# Patient Record
Sex: Female | Born: 1935 | Race: White | Hispanic: No | Smoking: Never smoker
Health system: Southern US, Community
[De-identification: ages and names within clinical notes are randomized; demographics above are authoritative.]

## PROBLEM LIST (undated history)

## (undated) DIAGNOSIS — H0230 Blepharochalasis unspecified eye, unspecified eyelid: Secondary | ICD-10-CM

## (undated) DIAGNOSIS — G629 Polyneuropathy, unspecified: Secondary | ICD-10-CM

## (undated) DIAGNOSIS — K589 Irritable bowel syndrome without diarrhea: Secondary | ICD-10-CM

## (undated) DIAGNOSIS — M810 Age-related osteoporosis without current pathological fracture: Secondary | ICD-10-CM

## (undated) HISTORY — DX: Blepharochalasis unspecified eye, unspecified eyelid: H02.30

## (undated) HISTORY — PX: CATARACT EXTRACTION: SUR2

## (undated) HISTORY — DX: Polyneuropathy, unspecified: G62.9

## (undated) HISTORY — DX: Irritable bowel syndrome, unspecified: K58.9

## (undated) HISTORY — DX: Age-related osteoporosis without current pathological fracture: M81.0

---

## 1968-01-14 HISTORY — PX: ABDOMINAL HYSTERECTOMY: SHX81

## 1992-01-14 HISTORY — PX: KNEE SURGERY: SHX244

## 2006-01-13 HISTORY — PX: WRIST SURGERY: SHX841

## 2014-09-07 ENCOUNTER — Ambulatory Visit (INDEPENDENT_AMBULATORY_CARE_PROVIDER_SITE_OTHER): Payer: Medicare Other

## 2014-09-07 ENCOUNTER — Ambulatory Visit (INDEPENDENT_AMBULATORY_CARE_PROVIDER_SITE_OTHER): Payer: Medicare Other | Admitting: Podiatry

## 2014-09-07 ENCOUNTER — Encounter: Payer: Self-pay | Admitting: Podiatry

## 2014-09-07 VITALS — BP 152/76 | HR 66 | Resp 16

## 2014-09-07 DIAGNOSIS — M79672 Pain in left foot: Secondary | ICD-10-CM | POA: Diagnosis not present

## 2014-09-07 DIAGNOSIS — M722 Plantar fascial fibromatosis: Secondary | ICD-10-CM | POA: Diagnosis not present

## 2014-09-07 NOTE — Patient Instructions (Signed)
Plantar Fasciitis (Heel Spur Syndrome) with Rehab The plantar fascia is a fibrous, ligament-like, soft-tissue structure that spans the bottom of the foot. Plantar fasciitis is a condition that causes pain in the foot due to inflammation of the tissue. SYMPTOMS   Pain and tenderness on the underneath side of the foot.  Pain that worsens with standing or walking. CAUSES  Plantar fasciitis is caused by irritation and injury to the plantar fascia on the underneath side of the foot. Common mechanisms of injury include:  Direct trauma to bottom of the foot.  Damage to a small nerve that runs under the foot where the main fascia attaches to the heel bone.  Stress placed on the plantar fascia due to bone spurs. RISK INCREASES WITH:   Activities that place stress on the plantar fascia (running, jumping, pivoting, or cutting).  Poor strength and flexibility.  Improperly fitted shoes.  Tight calf muscles.  Flat feet.  Failure to warm-up properly before activity.  Obesity. PREVENTION  Warm up and stretch properly before activity.  Allow for adequate recovery between workouts.  Maintain physical fitness:  Strength, flexibility, and endurance.  Cardiovascular fitness.  Maintain a health body weight.  Avoid stress on the plantar fascia.  Wear properly fitted shoes, including arch supports for individuals who have flat feet. PROGNOSIS  If treated properly, then the symptoms of plantar fasciitis usually resolve without surgery. However, occasionally surgery is necessary. RELATED COMPLICATIONS   Recurrent symptoms that may result in a chronic condition.  Problems of the lower back that are caused by compensating for the injury, such as limping.  Pain or weakness of the foot during push-off following surgery.  Chronic inflammation, scarring, and partial or complete fascia tear, occurring more often from repeated injections. TREATMENT  Treatment initially involves the use of  ice and medication to help reduce pain and inflammation. The use of strengthening and stretching exercises may help reduce pain with activity, especially stretches of the Achilles tendon. These exercises may be performed at home or with a therapist. Your caregiver may recommend that you use heel cups of arch supports to help reduce stress on the plantar fascia. Occasionally, corticosteroid injections are given to reduce inflammation. If symptoms persist for greater than 6 months despite non-surgical (conservative), then surgery may be recommended.  MEDICATION   If pain medication is necessary, then nonsteroidal anti-inflammatory medications, such as aspirin and ibuprofen, or other minor pain relievers, such as acetaminophen, are often recommended.  Do not take pain medication within 7 days before surgery.  Prescription pain relievers may be given if deemed necessary by your caregiver. Use only as directed and only as much as you need.  Corticosteroid injections may be given by your caregiver. These injections should be reserved for the most serious cases, because they may only be given a certain number of times. HEAT AND COLD  Cold treatment (icing) relieves pain and reduces inflammation. Cold treatment should be applied for 10 to 15 minutes every 2 to 3 hours for inflammation and pain and immediately after any activity that aggravates your symptoms. Use ice packs or massage the area with a piece of ice (ice massage).  Heat treatment may be used prior to performing the stretching and strengthening activities prescribed by your caregiver, physical therapist, or athletic trainer. Use a heat pack or soak the injury in warm water. SEEK IMMEDIATE MEDICAL CARE IF:  Treatment seems to offer no benefit, or the condition worsens.  Any medications produce adverse side effects. EXERCISES RANGE   OF MOTION (ROM) AND STRETCHING EXERCISES - Plantar Fasciitis (Heel Spur Syndrome) These exercises may help you  when beginning to rehabilitate your injury. Your symptoms may resolve with or without further involvement from your physician, physical therapist or athletic trainer. While completing these exercises, remember:   Restoring tissue flexibility helps normal motion to return to the joints. This allows healthier, less painful movement and activity.  An effective stretch should be held for at least 30 seconds.  A stretch should never be painful. You should only feel a gentle lengthening or release in the stretched tissue. RANGE OF MOTION - Toe Extension, Flexion  Sit with your right / left leg crossed over your opposite knee.  Grasp your toes and gently pull them back toward the top of your foot. You should feel a stretch on the bottom of your toes and/or foot.  Hold this stretch for __________ seconds.  Now, gently pull your toes toward the bottom of your foot. You should feel a stretch on the top of your toes and or foot.  Hold this stretch for __________ seconds. Repeat __________ times. Complete this stretch __________ times per day.  RANGE OF MOTION - Ankle Dorsiflexion, Active Assisted  Remove shoes and sit on a chair that is preferably not on a carpeted surface.  Place right / left foot under knee. Extend your opposite leg for support.  Keeping your heel down, slide your right / left foot back toward the chair until you feel a stretch at your ankle or calf. If you do not feel a stretch, slide your bottom forward to the edge of the chair, while still keeping your heel down.  Hold this stretch for __________ seconds. Repeat __________ times. Complete this stretch __________ times per day.  STRETCH - Gastroc, Standing  Place hands on wall.  Extend right / left leg, keeping the front knee somewhat bent.  Slightly point your toes inward on your back foot.  Keeping your right / left heel on the floor and your knee straight, shift your weight toward the wall, not allowing your back to  arch.  You should feel a gentle stretch in the right / left calf. Hold this position for __________ seconds. Repeat __________ times. Complete this stretch __________ times per day. STRETCH - Soleus, Standing  Place hands on wall.  Extend right / left leg, keeping the other knee somewhat bent.  Slightly point your toes inward on your back foot.  Keep your right / left heel on the floor, bend your back knee, and slightly shift your weight over the back leg so that you feel a gentle stretch deep in your back calf.  Hold this position for __________ seconds. Repeat __________ times. Complete this stretch __________ times per day. STRETCH - Gastrocsoleus, Standing  Note: This exercise can place a lot of stress on your foot and ankle. Please complete this exercise only if specifically instructed by your caregiver.   Place the ball of your right / left foot on a step, keeping your other foot firmly on the same step.  Hold on to the wall or a rail for balance.  Slowly lift your other foot, allowing your body weight to press your heel down over the edge of the step.  You should feel a stretch in your right / left calf.  Hold this position for __________ seconds.  Repeat this exercise with a slight bend in your right / left knee. Repeat __________ times. Complete this stretch __________ times per day.    STRENGTHENING EXERCISES - Plantar Fasciitis (Heel Spur Syndrome)  These exercises may help you when beginning to rehabilitate your injury. They may resolve your symptoms with or without further involvement from your physician, physical therapist or athletic trainer. While completing these exercises, remember:   Muscles can gain both the endurance and the strength needed for everyday activities through controlled exercises.  Complete these exercises as instructed by your physician, physical therapist or athletic trainer. Progress the resistance and repetitions only as guided. STRENGTH -  Towel Curls  Sit in a chair positioned on a non-carpeted surface.  Place your foot on a towel, keeping your heel on the floor.  Pull the towel toward your heel by only curling your toes. Keep your heel on the floor.  If instructed by your physician, physical therapist or athletic trainer, add ____________________ at the end of the towel. Repeat __________ times. Complete this exercise __________ times per day. STRENGTH - Ankle Inversion  Secure one end of a rubber exercise band/tubing to a fixed object (table, pole). Loop the other end around your foot just before your toes.  Place your fists between your knees. This will focus your strengthening at your ankle.  Slowly, pull your big toe up and in, making sure the band/tubing is positioned to resist the entire motion.  Hold this position for __________ seconds.  Have your muscles resist the band/tubing as it slowly pulls your foot back to the starting position. Repeat __________ times. Complete this exercises __________ times per day.  Document Released: 12/30/2004 Document Revised: 03/24/2011 Document Reviewed: 04/13/2008 Exit[employer] Patient Information 2015 [employer]. This information is not intended to replace advice given to you by your health care provider. Make sure you discuss any questions you have with your health care provider.  

## 2014-09-07 NOTE — Progress Notes (Signed)
Subjective:     Patient ID: Angie Perez, female   DOB: 1927/03/12, 81 y.o.   MRN: [id number]  HPI  81 year old female presents the office today with concerns of left heel pain which started in June after she was moving going up and down steps.  She states the pain is the bottom of her heel on the throbbing sensation has been ongoing.She states that she has pain after being on her feet or prolonged periods of walking. She denies any swelling or redness. No tingling or numbness. The pain does not wake her up at night. She said no prior treatment. Denies any specific injury or trauma. No other complaints at this time.  Review of Systems  All other systems reviewed and are negative.      Objective:   Physical Exam AAO x3, NAD DP/PT pulses palpable bilaterally, CRT less than 3 seconds Protective sensation intact with Simms Weinstein monofilament, vibratory sensation intact, Achilles tendon reflex intact; negative tinel sign  Tenderness to palpation overlying the plantar medial tubercle of the calcaneus to the left heel at the insertion of the plantar fascia. There is no pain along the course of plantar fascia within the arch of the foot. There is no pain with lateral compression of the calcaneus or pain the vibratory sensation. No pain on the posterior aspect of the calcaneus or along the course/insertion of the Achilles tendon. There is no overlying edema, erythema, increase in warmth. No other areas of tenderness palpation or pain with vibratory sensation to the foot/ankle. MMT 5/5, ROM WNL No open lesions or pre-ulcerative lesions are identified. No pain with calf compression, swelling, warmth, erythema.     Assessment:      81 year old female with left heel pain, likely plantar fasciitis.     Plan:     -X-rays were obtained and reviewed with the patient.  -Treatment options discussed including all alternatives, risks, and complications - I discussed steroid injection however she wishes to  hold off on that at this time. She also states that she is unable to take a lot of anti-inflammatories due to stomach problems. -Ice to the area daily. -Stretching exercises daily. -Discussed supportive shoe gear. -Dispensed plantar fascial brace. -Discuss orthotics. -Follow-up 4 weeks or sooner if any problems arise. In the meantime, encouraged to call the office with any questions, concerns, change in symptoms.   Celesta Gentile, DPM

## 2014-10-05 ENCOUNTER — Ambulatory Visit (INDEPENDENT_AMBULATORY_CARE_PROVIDER_SITE_OTHER): Payer: Medicare Other | Admitting: Podiatry

## 2014-10-05 ENCOUNTER — Encounter: Payer: Self-pay | Admitting: Podiatry

## 2014-10-05 VITALS — BP 157/75 | HR 77 | Resp 18

## 2014-10-05 DIAGNOSIS — M722 Plantar fascial fibromatosis: Secondary | ICD-10-CM | POA: Diagnosis not present

## 2014-10-06 NOTE — Progress Notes (Signed)
Patient ID: Angie Perez, female   DOB: [birth year], 81 y.o.   MRN: [id number]  Subjective: 81 year old female presents the office they for follow-up evaluation of left heel pain, plantar fasciitis. She states that her heels much improved compared to last appointment and the brace is helping quite a bit. She is a mild intermittent discomfort at times however significant improved. Denies any redness or drainage. No tenderness. The pain does not wake up at night. She does continue with stretching, icing daily. She is continue with supportive shoe gear. No other complaints at this time in no acute changes.  Objective: AAO x3, NAD DP/PT pulses palpable bilaterally, CRT less than 3 seconds Protective sensation intact with Simms Weinstein monofilament, vibratory sensation intact, Achilles tendon reflex intact There is very mild tenderness to palpation overlying the plantar medial tubercle of the calcaneus to the left heel at the insertion of the plantar fascia. There is no pain along the course of plantar fascia within the arch of the foot. Plantar fascia appears intact. There is no pain with lateral compression of the calcaneus or pain the vibratory sensation. No pain on the posterior aspect of the calcaneus or along the course/insertion of the Achilles tendon. There is no overlying edema, erythema, increase in warmth. No other areas of tenderness palpation or pain with vibratory sensation to the foot/ankle.  No open lesions or pre-ulcerative lesions are identified. No pain with calf compression, swelling, warmth, erythema.  Assessment: 81 year old female with resolving left heel pain, plantar fasciitis  Plan: -Treatment options discussed including all alternatives, risks, and complications -I discussed steroid injection however since her symptoms are greatly improved we'll hold off on this. -Continue stretching, icing activities daily. Continue supportive shoe gear. Discussed orthotics. -Follow-up in 4  weeks if symptoms continue or sooner if any problems arise. In the meantime, encouraged to call the office with any questions, concerns, change in symptoms.   Celesta Gentile, DPM

## 2014-11-02 ENCOUNTER — Ambulatory Visit: Payer: Medicare Other | Admitting: Podiatry

## 2016-02-27 ENCOUNTER — Encounter: Payer: Self-pay | Admitting: *Deleted

## 2016-03-11 ENCOUNTER — Encounter: Payer: Self-pay | Admitting: *Deleted

## 2016-03-12 ENCOUNTER — Encounter: Payer: Self-pay | Admitting: General Surgery

## 2016-03-12 ENCOUNTER — Ambulatory Visit (INDEPENDENT_AMBULATORY_CARE_PROVIDER_SITE_OTHER): Payer: Medicare Other | Admitting: General Surgery

## 2016-03-12 VITALS — BP 132/68 | HR 68 | Resp 12 | Ht 60.0 in | Wt 118.0 lb

## 2016-03-12 DIAGNOSIS — C4372 Malignant melanoma of left lower limb, including hip: Secondary | ICD-10-CM

## 2016-03-12 NOTE — Patient Instructions (Addendum)
The patient is aware to call back for any questions or concerns.  Wide excision left lower leg with SLN biopsy on an outpatient basis at [facility].

## 2016-03-12 NOTE — Progress Notes (Signed)
Patient ID: Angie Perez, female   DOB: 1927-07-11, 83 y.o.   MRN: [id number]  Chief Complaint  Patient presents with  . Other    melanoma leg    HPI Angie Perez is a 83 y.o. female.  Here today for evaluation of left lower leg melanoma referred by Dr Kellie Moor. She noticed this area about 6-8 months ago. She did have a biopsy February 22 2016. She states the area is some tender. She lives at the [facility]. The patient is a native of [place], [place].     HPI  Past Medical History:  Diagnosis Date  . Fuchs' syndrome II   . IBS (irritable bowel syndrome)   . Osteoporosis   . Peripheral neuropathy [facility])     Past Surgical History:  Procedure Laterality Date  . ABDOMINAL HYSTERECTOMY  1970  . KNEE SURGERY Right 1994  . WRIST SURGERY Right 2008    Family History  Problem Relation Age of Onset  . Cancer Mother   . Heart disease Father     Social History Social History  Substance Use Topics  . Smoking status: Never Smoker  . Smokeless tobacco: Never Used  . Alcohol use No    Allergies  Allergen Reactions  . Naproxen Nausea Only and Rash    Current Outpatient Prescriptions  Medication Sig Dispense Refill  . aspirin EC 81 MG tablet Take 81 mg by mouth daily.     [name] gabapentin (NEURONTIN) 800 MG tablet Take 800 mg by mouth at bedtime.     . raloxifene (EVISTA) 60 MG tablet Take 60 mg by mouth daily.     . sodium chloride (MURO 128) 5 % ophthalmic solution Place 1 drop into both eyes 3 (three) times daily.      No current facility-administered medications for this visit.     Review of Systems Review of Systems  Constitutional: Negative.   Respiratory: Negative.   Cardiovascular: Negative.     Blood pressure 132/68, pulse 68, resp. rate 12, height 5' (1.524 m), weight 118 lb (53.5 kg).  Physical Exam Physical Exam  Constitutional: She is oriented to person, place, and time. She appears well-developed and well-nourished.   HENT:  Mouth/Throat: Oropharynx is clear and moist.  Eyes: Conjunctivae are normal. No scleral icterus.  Neck: Neck supple.  Cardiovascular: Normal rate, regular rhythm and normal heart sounds.   Pulses:      Femoral pulses are 2+ on the right side, and 2+ on the left side.      Dorsalis pedis pulses are 2+ on the right side, and 2+ on the left side.       Posterior tibial pulses are 2+ on the right side, and 2+ on the left side.  No lower leg edema  Pulmonary/Chest: Effort normal and breath sounds normal.  Musculoskeletal:       Legs: Lymphadenopathy:    She has no cervical adenopathy.       Right: No inguinal adenopathy present.       Left: No inguinal adenopathy present.  Neurological: She is alert and oriented to person, place, and time.  Skin: Skin is warm and dry.  8 x 12 lesion posterior left leg/calf  Psychiatric: Her behavior is normal.    Data Reviewed Dermatology notes of 02/22/2016 reviewed.  Pathology from the left lower extremity biopsy shows lentigo maligna melanoma with a breast flow depth of 1.3 mm. No ulceration identified. Melanoma in situ extends to the edge. Clark level  IV.  Mitoses: 2/mm. No regression or lymphovascular invasion noted. pT 2A.  [id number] basic metabolic panel showed normal electrolytes and a creatinine of 0.8 with an estimated GFR of 68. Assessment    Malignant melanoma involving the left lower extremity.  Excellent performance status.    Plan    The patient's case had been presented at the [facility] tumor board prior to today's visit. Sentinel node biopsy was felt appropriate even if no plans for formal ilioinguinal node dissection would be planned.   Indications for reexcision and sentinel node biopsy reviewed.   Wide excision left lower leg with SLN biopsy on an outpatient basis at [facility].  Patient's surgery has been scheduled for 03-17-16 at [facility]. She may continue her 81 mg aspirin once daily.   She requested that no family be  contacted unless it was a life and death emergency.  We'll arrange for a chest x-ray and liver function studies prior to surgery.  This information has been scribed by Karie Fetch RN, BSN,BC.   Robert Bellow 03/13/2016, 1:53 PM

## 2016-03-13 DIAGNOSIS — C4372 Malignant melanoma of left lower limb, including hip: Secondary | ICD-10-CM | POA: Insufficient documentation

## 2016-03-14 ENCOUNTER — Encounter
Admission: RE | Admit: 2016-03-14 | Discharge: 2016-03-14 | Disposition: A | Discharge status: Home / Self Care | Payer: Medicare Other | Source: Ambulatory Visit | Attending: General Surgery | Admitting: General Surgery

## 2016-03-14 ENCOUNTER — Encounter
Admission: RE | Admit: 2016-03-14 | Discharge: 2016-03-14 | Disposition: A | Discharge status: Home / Self Care | Payer: Medicare Other | Source: Ambulatory Visit | Attending: Internal Medicine | Admitting: Internal Medicine

## 2016-03-14 NOTE — Patient Instructions (Signed)
Your procedure is scheduled on: 03/17/16 Report to radiology desk 1st [place].   Remember: Instructions that are not followed completely may result in serious medical risk, up to and including death, or upon the discretion of your surgeon and anesthesiologist your surgery may need to be rescheduled.    __X__ 1. Do not eat food or drink liquids after midnight. No gum chewing or hard candies.     __X__ 2. No Alcohol for 24 hours before or after surgery.   ____ 3. Bring all medications with you on the day of surgery if instructed.    __X__ 4. Notify your doctor if there is any change in your medical condition     (cold, fever, infections).             __x___5. No smoking within 24 hours of your surgery.     Do not wear jewelry, make-up, hairpins, clips or nail polish.  Do not wear lotions, powders, or perfumes.   Do not shave 48 hours prior to surgery. Men may shave face and neck.  Do not bring valuables to the hospital.    [facility] is not responsible for any belongings or valuables.               Contacts, dentures or bridgework may not be worn into surgery.  Leave your suitcase in the car. After surgery it may be brought to your room.  For patients admitted to the hospital, discharge time is determined by your                treatment team.   Patients discharged the day of surgery will not be allowed to drive home.   Please read over the following fact sheets that you were given:      ____ Take these medicines the morning of surgery with A SIP OF WATER:    1. none  2.   3.   4.  5.  6.  ____ Fleet Enema (as directed)   ____ Use CHG Soap as directed  ____ Use inhalers on the day of surgery  ____ Stop metformin 2 days prior to surgery    ____ Take 1/2 of usual insulin dose the night before surgery and none on the morning of surgery.   ____ Stop Coumadin/Plavix/aspirin on   __X__ Stop Anti-inflammatories such as Advil, Aleve, Ibuprofen, Motrin,  Naproxen, Naprosyn, Goodies,powder, or aspirin products.  OK to take Tylenol.   ____ Stop supplements until after surgery.    ____ Bring C-Pap to the hospital.

## 2016-03-17 ENCOUNTER — Ambulatory Visit
Admission: RE | Admit: 2016-03-17 | Discharge: 2016-03-17 | Disposition: A | Discharge status: Home / Self Care | Payer: Medicare Other | Source: Ambulatory Visit | Attending: General Surgery | Admitting: General Surgery

## 2016-03-17 ENCOUNTER — Other Ambulatory Visit: Payer: Self-pay

## 2016-03-17 ENCOUNTER — Ambulatory Visit: Payer: Medicare Other | Admitting: Anesthesiology

## 2016-03-17 ENCOUNTER — Ambulatory Visit: Admission: RE | Admit: 2016-03-17 | Payer: Medicare Other | Source: Ambulatory Visit | Admitting: General Surgery

## 2016-03-17 ENCOUNTER — Ambulatory Visit: Admission: RE | Admit: 2016-03-17 | Payer: Medicare Other | Source: Ambulatory Visit | Admitting: Anesthesiology

## 2016-03-17 ENCOUNTER — Encounter
Admission: RE | Disposition: A | Discharge status: Home / Self Care | Payer: Self-pay | Source: Ambulatory Visit | Attending: General Surgery

## 2016-03-17 DIAGNOSIS — M810 Age-related osteoporosis without current pathological fracture: Secondary | ICD-10-CM | POA: Diagnosis not present

## 2016-03-17 DIAGNOSIS — C439 Malignant melanoma of skin, unspecified: Secondary | ICD-10-CM

## 2016-03-17 DIAGNOSIS — Z91040 Latex allergy status: Secondary | ICD-10-CM | POA: Diagnosis not present

## 2016-03-17 DIAGNOSIS — Z7982 Long term (current) use of aspirin: Secondary | ICD-10-CM | POA: Diagnosis not present

## 2016-03-17 DIAGNOSIS — Z79899 Other long term (current) drug therapy: Secondary | ICD-10-CM | POA: Diagnosis not present

## 2016-03-17 DIAGNOSIS — C4372 Malignant melanoma of left lower limb, including hip: Secondary | ICD-10-CM | POA: Diagnosis present

## 2016-03-17 DIAGNOSIS — G629 Polyneuropathy, unspecified: Secondary | ICD-10-CM | POA: Insufficient documentation

## 2016-03-17 DIAGNOSIS — Z809 Family history of malignant neoplasm, unspecified: Secondary | ICD-10-CM | POA: Diagnosis not present

## 2016-03-17 DIAGNOSIS — Z8249 Family history of ischemic heart disease and other diseases of the circulatory system: Secondary | ICD-10-CM | POA: Insufficient documentation

## 2016-03-17 DIAGNOSIS — Z79810 Long term (current) use of selective estrogen receptor modulators (SERMs): Secondary | ICD-10-CM | POA: Insufficient documentation

## 2016-03-17 HISTORY — PX: SENTINEL NODE BIOPSY: SHX6608

## 2016-03-17 HISTORY — PX: EXCISION MELANOMA WITH SENTINEL LYMPH NODE BIOPSY: SHX5628

## 2016-03-17 LAB — HEPATIC FUNCTION PANEL
ALBUMIN: 3.8 g/dL (ref 3.5–5.0)
ALK PHOS: 19 U/L — AB (ref 38–126)
ALT: 10 U/L — AB (ref 14–54)
AST: 20 U/L (ref 15–41)
Bilirubin, Direct: <0.1 mg/dL — ABNORMAL LOW (ref 0.1–0.5)
TOTAL PROTEIN: 6.8 g/dL (ref 6.5–8.1)
Total Bilirubin: 0.5 mg/dL (ref 0.3–1.2)

## 2016-03-17 LAB — CBC WITH DIFFERENTIAL/PLATELET
BASOS ABS: 0.1 10*3/uL (ref 0–0.1)
BASOS PCT: 1 %
Eosinophils Absolute: 0.1 10*3/uL (ref 0–0.7)
Eosinophils Relative: 2 %
HEMATOCRIT: 39.4 % (ref 35.0–47.0)
HEMOGLOBIN: 13.1 g/dL (ref 12.0–16.0)
LYMPHS PCT: 16 %
Lymphs Abs: 1.1 10*3/uL (ref 1.0–3.6)
MCH: 28.8 pg (ref 26.0–34.0)
MCHC: 33.4 g/dL (ref 32.0–36.0)
MCV: 86.3 fL (ref 80.0–100.0)
Monocytes Absolute: 0.8 10*3/uL (ref 0.2–0.9)
Monocytes Relative: 11 %
NEUTROS ABS: 4.9 10*3/uL (ref 1.4–6.5)
NEUTROS PCT: 70 %
Platelets: 181 10*3/uL (ref 150–440)
RBC: 4.57 MIL/uL (ref 3.80–5.20)
RDW: 14.3 % (ref 11.5–14.5)
WBC: 7 10*3/uL (ref 3.6–11.0)

## 2016-03-17 LAB — LACTATE DEHYDROGENASE: LDH: 154 U/L (ref 98–192)

## 2016-03-17 SURGERY — EXCISION, MELANOMA, WITH SENTINEL LYMPH NODE BIOPSY
Anesthesia: General | Laterality: Left | Wound class: Clean

## 2016-03-17 MED ORDER — ACETAMINOPHEN 10 MG/ML IV SOLN
INTRAVENOUS | Status: AC
  Filled 2016-03-17: qty 100

## 2016-03-17 MED ORDER — BUPIVACAINE HCL (PF) 0.5 % IJ SOLN
INTRAMUSCULAR | Status: AC
  Filled 2016-03-17: qty 30

## 2016-03-17 MED ORDER — TECHNETIUM TC 99M SULFUR COLLOID FILTERED
0.5000 | Freq: Once | INTRAVENOUS | Status: AC | PRN
  Administered 2016-03-17: 0.519 via INTRADERMAL

## 2016-03-17 MED ORDER — ONDANSETRON HCL 4 MG/2ML IJ SOLN: 4.0000 mg | Freq: Once | INTRAMUSCULAR | Status: DC | PRN

## 2016-03-17 MED ORDER — ONDANSETRON HCL 4 MG/2ML IJ SOLN
INTRAMUSCULAR | Status: DC | PRN
  Administered 2016-03-17: 4 mg via INTRAVENOUS

## 2016-03-17 MED ORDER — FAMOTIDINE 20 MG PO TABS
20.0000 mg | ORAL_TABLET | Freq: Once | ORAL | Status: AC
  Administered 2016-03-17: 20 mg via ORAL

## 2016-03-17 MED ORDER — FAMOTIDINE 20 MG PO TABS
ORAL_TABLET | ORAL | Status: AC
  Administered 2016-03-17: 20 mg via ORAL
  Filled 2016-03-17: qty 1

## 2016-03-17 MED ORDER — PROPOFOL 10 MG/ML IV BOLUS
INTRAVENOUS | Status: AC
  Filled 2016-03-17: qty 20

## 2016-03-17 MED ORDER — METHYLENE BLUE 0.5 % INJ SOLN
INTRAVENOUS | Status: AC
  Filled 2016-03-17: qty 10

## 2016-03-17 MED ORDER — DEXAMETHASONE SODIUM PHOSPHATE 10 MG/ML IJ SOLN
INTRAMUSCULAR | Status: DC | PRN
  Administered 2016-03-17: 5 mg via INTRAVENOUS

## 2016-03-17 MED ORDER — FENTANYL CITRATE (PF) 100 MCG/2ML IJ SOLN
INTRAMUSCULAR | Status: AC
  Administered 2016-03-17: 25 ug via INTRAVENOUS
  Filled 2016-03-17: qty 2

## 2016-03-17 MED ORDER — PROPOFOL 10 MG/ML IV BOLUS
INTRAVENOUS | Status: DC | PRN
  Administered 2016-03-17: 140 mg via INTRAVENOUS

## 2016-03-17 MED ORDER — ACETAMINOPHEN 10 MG/ML IV SOLN
INTRAVENOUS | Status: DC | PRN
  Administered 2016-03-17: 1000 mg via INTRAVENOUS

## 2016-03-17 MED ORDER — LACTATED RINGERS IV SOLN
INTRAVENOUS | Status: DC
  Administered 2016-03-17: 13:00:00 via INTRAVENOUS

## 2016-03-17 MED ORDER — ONDANSETRON HCL 4 MG/2ML IJ SOLN
INTRAMUSCULAR | Status: AC
  Filled 2016-03-17: qty 2

## 2016-03-17 MED ORDER — METHYLENE BLUE 0.5 % INJ SOLN
INTRAVENOUS | Status: DC | PRN
  Administered 2016-03-17: 4 mL via SUBMUCOSAL

## 2016-03-17 MED ORDER — BUPIVACAINE-EPINEPHRINE (PF) 0.5% -1:200000 IJ SOLN
INTRAMUSCULAR | Status: DC | PRN
  Administered 2016-03-17: 15 mL via PERINEURAL
  Administered 2016-03-17: 15 mL

## 2016-03-17 MED ORDER — FENTANYL CITRATE (PF) 100 MCG/2ML IJ SOLN
INTRAMUSCULAR | Status: DC | PRN
  Administered 2016-03-17 (×4): 25 ug via INTRAVENOUS

## 2016-03-17 MED ORDER — FENTANYL CITRATE (PF) 100 MCG/2ML IJ SOLN
INTRAMUSCULAR | Status: AC
  Filled 2016-03-17: qty 2

## 2016-03-17 MED ORDER — HYDROCODONE-ACETAMINOPHEN 5-325 MG PO TABS: 1.0000 | ORAL_TABLET | ORAL | 0 refills | Status: DC | PRN

## 2016-03-17 MED ORDER — DEXAMETHASONE SODIUM PHOSPHATE 10 MG/ML IJ SOLN
INTRAMUSCULAR | Status: AC
  Filled 2016-03-17: qty 1

## 2016-03-17 MED ORDER — FENTANYL CITRATE (PF) 100 MCG/2ML IJ SOLN
25.0000 ug | INTRAMUSCULAR | Status: DC | PRN
  Administered 2016-03-17 (×4): 25 ug via INTRAVENOUS

## 2016-03-17 SURGICAL SUPPLY — 43 items
BAG COUNTER SPONGE EZ (MISCELLANEOUS) IMPLANT
BANDAGE ACE 4X5 VEL STRL LF (GAUZE/BANDAGES/DRESSINGS) ×3 IMPLANT
BLADE SURG 15 STRL SS SAFETY (BLADE) ×3 IMPLANT
BNDG GAUZE 4.5X4.1 6PLY STRL (MISCELLANEOUS) ×6 IMPLANT
CANISTER SUCT 1200ML W/VALVE (MISCELLANEOUS) ×3 IMPLANT
CHLORAPREP W/TINT 26ML (MISCELLANEOUS) ×3 IMPLANT
CLOSURE WOUND 1/2 X4 (GAUZE/BANDAGES/DRESSINGS) ×2
COUNTER SPONGE BAG EZ (MISCELLANEOUS)
COVER PROBE FLX POLY STRL (MISCELLANEOUS) ×3 IMPLANT
DRAPE LAPAROTOMY 100X77 ABD (DRAPES) IMPLANT
DRAPE LAPAROTOMY 77X122 PED (DRAPES) ×6 IMPLANT
DRESSING TELFA 4X3 1S ST N-ADH (GAUZE/BANDAGES/DRESSINGS) ×9 IMPLANT
DRSG TEGADERM 4X4.75 (GAUZE/BANDAGES/DRESSINGS) ×9 IMPLANT
ELECT CAUTERY BLADE 6.4 (BLADE) ×3 IMPLANT
ELECT REM PT RETURN 9FT ADLT (ELECTROSURGICAL) ×3
ELECTRODE REM PT RTRN 9FT ADLT (ELECTROSURGICAL) ×1 IMPLANT
GAUZE FLUFF 18X24 1PLY STRL (GAUZE/BANDAGES/DRESSINGS) ×3 IMPLANT
GLOVE BIO SURGEON STRL SZ7.5 (GLOVE) ×9 IMPLANT
GLOVE INDICATOR 8.0 STRL GRN (GLOVE) ×9 IMPLANT
GOWN STRL REUS W/ TWL LRG LVL3 (GOWN DISPOSABLE) ×3 IMPLANT
GOWN STRL REUS W/TWL LRG LVL3 (GOWN DISPOSABLE) ×6
KIT RM TURNOVER STRD PROC AR (KITS) ×3 IMPLANT
LABEL OR SOLS (LABEL) IMPLANT
NDL SAFETY 18GX1.5 (NEEDLE) ×3 IMPLANT
NDL SAFETY 22GX1.5 (NEEDLE) ×3 IMPLANT
NEEDLE HYPO 25X1 1.5 SAFETY (NEEDLE) ×3 IMPLANT
NS IRRIG 500ML POUR BTL (IV SOLUTION) ×3 IMPLANT
PACK BASIN MINOR ARMC (MISCELLANEOUS) ×3 IMPLANT
SLEVE PROBE SENORX GAMMA FIND (MISCELLANEOUS) IMPLANT
STRAP SAFETY BODY (MISCELLANEOUS) IMPLANT
STRIP CLOSURE SKIN 1/2X4 (GAUZE/BANDAGES/DRESSINGS) ×4 IMPLANT
SUT ETHILON 3 0 PS 1 (SUTURE) ×6 IMPLANT
SUT ETHILON 4-0 (SUTURE) ×4
SUT ETHILON 4-0 FS2 18XMFL BLK (SUTURE) ×2
SUT VIC AB 2-0 CT1 (SUTURE) ×6 IMPLANT
SUT VIC AB 3-0 SH 27 (SUTURE) ×2
SUT VIC AB 3-0 SH 27X BRD (SUTURE) ×1 IMPLANT
SUT VIC AB 4-0 FS2 27 (SUTURE) ×3 IMPLANT
SUT VICRYL+ 3-0 144IN (SUTURE) ×3 IMPLANT
SUTURE ETHLN 4-0 FS2 18XMF BLK (SUTURE) ×2 IMPLANT
SWABSTK COMLB BENZOIN TINCTURE (MISCELLANEOUS) ×3 IMPLANT
SYRINGE 10CC LL (SYRINGE) ×6 IMPLANT
VICRYL 30 REEL IMPLANT

## 2016-03-17 NOTE — Transfer of Care (Signed)
Immediate Anesthesia Transfer of Care Note  Patient: Angie Perez  Procedure(s) Performed: Procedure(s): EXCISION MELANOMA LEFT LEG (Left) SENTINEL NODE BIOPSY (Left)  Patient Location: PACU  Anesthesia Type:General  Level of Consciousness: sedated  Airway & Oxygen Therapy: Patient Spontanous Breathing and Patient connected to face mask oxygen  Post-op Assessment: Report given to RN and Post -op Vital signs reviewed and stable  Post vital signs: Reviewed and stable  Last Vitals:  Vitals:   03/17/16 1231 03/17/16 1453  BP: (!) 171/65 (!) 176/77  Pulse: 77 74  Resp: 16 15  Temp: 36.3 C 36.3 C    Last Pain:  Vitals:   03/17/16 1453  TempSrc:   PainSc: Asleep         Complications: No apparent anesthesia complications

## 2016-03-17 NOTE — NC FL2 (Signed)
  [place] LEVEL OF CARE SCREENING TOOL     IDENTIFICATION  Patient Name: Angie Perez Birthdate: 1927-10-26 Sex: female Admission Date (Current Location): 03/17/2016  [place] and [place] Number:  Engineering geologist and Address:  [facility], [address], [place], [place] [address]      Provider Number: [id number]  Attending Physician Name and Address:  Robert Bellow, MD  Relative Name and Phone Number:       Current Level of Care: Hospital Recommended Level of Care: [place] Prior Approval Number:    Date Approved/Denied:   PASRR Number:    Discharge Plan: SNF      Fuchs' syndrome II   . IBS (irritable bowel syndrome)   . Osteoporosis   . Peripheral neuropathy [facility])          Past Surgical History:  Procedure Laterality Date  . ABDOMINAL HYSTERECTOMY  1970  . KNEE SURGERY Right 1994  . WRIST SURGERY Right 2008    Current Diagnoses: Patient Active Problem List   Diagnosis Date Noted  . Melanoma of lower leg, left ([place]) 03/13/2016    Orientation RESPIRATION BLADDER Height & Weight     Self, Time, Situation, Place  Normal Continent Weight: 118 lb (53.5 kg) Height:     BEHAVIORAL SYMPTOMS/MOOD NEUROLOGICAL BOWEL NUTRITION STATUS   (none)  (none) Continent Diet  AMBULATORY STATUS COMMUNICATION OF NEEDS Skin   Limited Assist Verbally Surgical wounds                       Personal Care Assistance Level of Assistance  Bathing, Dressing Bathing Assistance: Limited assistance   Dressing Assistance: Limited assistance     Functional Limitations Info   (no issues)          SPECIAL CARE FACTORS FREQUENCY  PT (By licensed PT)                    Contractures Contractures Info: Not present    Additional Factors Info  Code Status, Allergies Code Status Info: full Allergies Info: latex, naproxen           Current Medications (03/17/2016):  This is the current  hospital active medication list Current Facility-Administered Medications  Medication Dose Route Frequency Provider Last Rate Last Dose  . lactated ringers infusion   Intravenous Continuous Alvin Critchley, MD 75 mL/hr at 03/17/16 1238     Facility-Administered Medications Ordered in Other Encounters  Medication Dose Route Frequency Provider Last Rate Last Dose  . acetaminophen (OFIRMEV) IV   Intravenous Anesthesia Intra-op Jonna Clark, CRNA   1,000 mg at 03/17/16 1427  . dexamethasone (DECADRON) injection   Intravenous Anesthesia Intra-op Jonna Clark, CRNA   5 mg at 03/17/16 1345  . fentaNYL (SUBLIMAZE) injection    Anesthesia Intra-op Jonna Clark, CRNA   25 mcg at 03/17/16 1418  . ondansetron (ZOFRAN) injection   Intravenous Anesthesia Intra-op Jonna Clark, CRNA   4 mg at 03/17/16 1436  . propofol (DIPRIVAN) 10 mg/mL bolus/IV push    Anesthesia Intra-op Jonna Clark, CRNA   140 mg at 03/17/16 1302     Discharge Medications: Please see discharge summary for a list of discharge medications.  Relevant Imaging Results:  Relevant Lab Results:   Additional Information    Shela Leff, LCSW

## 2016-03-17 NOTE — Discharge Instructions (Signed)
  AMBULATORY SURGERY  DISCHARGE INSTRUCTIONS   1) The drugs that you were given will stay in your system until tomorrow so for the next 24 hours you should not:  A) Drive an automobile B) Make any legal decisions C) Drink any alcoholic beverage   2) You may resume regular meals tomorrow.  Today it is better to start with liquids and gradually work up to solid foods.  You may eat anything you prefer, but it is better to start with liquids, then soup and crackers, and gradually work up to solid foods.   3) Please notify your doctor immediately if you have any unusual bleeding, trouble breathing, redness and pain at the surgery site, drainage, fever, or pain not relieved by medication.    4) Additional Instructions: TAKE A STOOL SOFTENER TWICE A DAY WHILE TAKING NARCOTIC PAIN MEDICINE TO PREVENT CONSTIPATION   Please contact your physician with any problems or Same Day Surgery at [phone], Monday through Friday 6 am to 4 pm, or [place] [facility] [place] Main number at [phone].   

## 2016-03-17 NOTE — Clinical Social Work Note (Signed)
CSW received call from Dr. Bary Castilla stating that patient is a resident of [place] and that she wanted to transfer from surgery today to [place]. CSW has spoken to [place] at [place] and they made arrangements for patient on Friday to be able to come. This CSW has completed an FL2 and sent to [facility]. Patient's discharge instructions will need to go with her to [facility]: Rm: [phone]; [phone]. CSW has spoken with Raquel Sarna, patient's PACU nurse, and provided her with the information to call report. Raquel Sarna is going to check to see if patient has someone waiting with her that can transport her to [facility] and if not, she will call CSW back.

## 2016-03-17 NOTE — Anesthesia Preprocedure Evaluation (Signed)
Anesthesia Evaluation  Patient identified by MRN, date of birth, ID band Patient awake    Reviewed: Allergy & Precautions, H&P , NPO status , Patient's Chart, lab work & pertinent test results, reviewed documented beta blocker date and time   Airway Mallampati: II  TM Distance: >3 FB Neck ROM: full    Dental  (+) Teeth Intact   Pulmonary neg pulmonary ROS,    Pulmonary exam normal        Cardiovascular Exercise Tolerance: Good negative cardio ROS Normal cardiovascular exam Rate:Normal     Neuro/Psych negative neurological ROS  negative psych ROS   GI/Hepatic negative GI ROS, Neg liver ROS,   Endo/Other  negative endocrine ROS  Renal/GU negative Renal ROS  negative genitourinary   Musculoskeletal   Abdominal   Peds  Hematology negative hematology ROS (+)   Anesthesia Other Findings   Reproductive/Obstetrics negative OB ROS                             Anesthesia Physical Anesthesia Plan  ASA: II  Anesthesia Plan: General LMA   Post-op Pain Management:    Induction:   Airway Management Planned:   Additional Equipment:   Intra-op Plan:   Post-operative Plan:   Informed Consent: I have reviewed the patients History and Physical, chart, labs and discussed the procedure including the risks, benefits and alternatives for the proposed anesthesia with the patient or authorized representative who has indicated his/her understanding and acceptance.     Plan Discussed with: CRNA  Anesthesia Plan Comments:         Anesthesia Quick Evaluation  

## 2016-03-17 NOTE — H&P (Signed)
No change in clinical history or exam. For left lower extremity wide excision, SLN biopsy for melanoma.

## 2016-03-17 NOTE — Anesthesia Post-op Follow-up Note (Cosign Needed)
Anesthesia QCDR form completed.        

## 2016-03-17 NOTE — Anesthesia Procedure Notes (Signed)
Procedure Name: LMA Insertion Date/Time: 03/17/2016 3:04 PM Performed by: Nelda Marseille Pre-anesthesia Checklist: Patient identified, Patient being monitored, Timeout performed, Emergency Drugs available and Suction available Patient Re-evaluated:Patient Re-evaluated prior to inductionOxygen Delivery Method: Circle system utilized Preoxygenation: Pre-oxygenation with 100% oxygen Intubation Type: IV induction Ventilation: Mask ventilation without difficulty LMA: LMA inserted LMA Size: 4.0 Tube type: Oral Number of attempts: 1 Placement Confirmation: positive ETCO2 and breath sounds checked- equal and bilateral Tube secured with: Tape Dental Injury: Teeth and Oropharynx as per pre-operative assessment

## 2016-03-17 NOTE — Op Note (Signed)
Preoperative diagnosis: Malignant melanoma left posterior lateral calf.  Postoperative diagnosis: Same.  Operative procedure: Left lower extremity melanoma excision wide excision with primary closure, biopsy of the left inguinal and left external iliac lymph nodes.  Operating surgeon: Ollen Bowl, M.D.  Anesthesia: Gen. by LMA, Marcaine 0.5% with 1-200,000 units of epinephrine, 30 mL.  Estimated blood loss: 10 mL.  Clinical note: This 83 year old woman recently underwent biopsy of a left posterior lateral calf lesion with identification of a Breslow thickness 1.3 mm melanoma. Her case was presented at the [facility] tumor board and sentinel node biopsy felt to be appropriate for post her seizure treatment. She underwent injection with technetium sulfur colloid prior to the procedure. After induction of anesthesia 4 mL of 0.5% methylene blue was injected as well.  Operative note: With the patient under adequate general anesthesia and after the injection of methylene blue the left groin which had previously been marked by the radiologist was prepped with ChloraPrep and draped. The node seeker device was utilized and the superficial femoral node was identified about 2 cm cephalad to the mark. The soft tissue was infiltrated with local anesthetic and a transverse incision made and carried through the superficial fascia. 2 hot, non-blue nodes were identified. Further scanning showed no other areas of increased uptake. The superficial fascia was approximated with interrupted 3-0 Vicryl figure-of-eight sutures and later the skin closed with a running 4-0 Vicryl subcuticular suture.  Attention was turned to the higher area overlying the external iliac chain. The area was infiltrated with local anesthetic. The skin was incised followed by the subcutaneous fascia. The external Bleich was opened in the direction of its fibers and the internal Bleich fibers and the tail of the transversus abdominis muscle fibers  spread. The knee was swept medially and a single hot, non-blue lymph node from the external iliac chain was removed. Good hemostasis was noted. This wound was closed with a 2-0 Vicryl figure-of-eight suture to the internal oblique muscle and a running 2-0 Vicryl suture to the external oblique fascia. Scarpa's fascia was approximated with 3-0 Vicryl the skin closed with a running 4-0 Vicryl subcuticular suture.  The patient's leg was then repositioned to allow exposure of the posterior lateral wound on the left distal calf. The leg was padded and propped with a IV bag below the left hip to provide adequate rotation. The site was prepped with Betadine solution due to the open wound and draped. The remaining melanoma site measured approximately 1.2 cm in diameter. One centimeter margins were chosen. This area was outlined and then infiltrated 2 cm on this with the above-mentioned local anesthesia. An elliptical incision was made and carried down to but not including the muscle fascia. The adipose tissue was then mobilized circumferentially for approximate 5 cm. It was then approximated with interrupted 2-0 Vicryl figure-of-eight sutures. The skin was approximated with interrupted 3-0 nylon horizontal mattress sutures. Telfa, Kerlix, fluff gauze followed by a compressive Ace wrap was applied.  Groin wounds were dressed with Telfa and Tegaderm.  The patient tolerated the procedure well and was taken to the recovery room in stable condition.

## 2016-03-17 NOTE — OR Nursing (Signed)
Dr Andree Elk reviewed chest xray and EKG.  Ok per Dr Andree Elk.

## 2016-03-18 ENCOUNTER — Encounter: Payer: Self-pay | Admitting: General Surgery

## 2016-03-18 NOTE — Anesthesia Postprocedure Evaluation (Signed)
Anesthesia Post Note  Patient: Pavandeep Lawler  Procedure(s) Performed: Procedure(s) (LRB): EXCISION MELANOMA LEFT LEG (Left) SENTINEL NODE BIOPSY (Left)  Patient location during evaluation: PACU Anesthesia Type: General Level of consciousness: awake and alert Pain management: pain level controlled Vital Signs Assessment: post-procedure vital signs reviewed and stable Respiratory status: spontaneous breathing, nonlabored ventilation, respiratory function stable and patient connected to nasal cannula oxygen Cardiovascular status: blood pressure returned to baseline and stable Postop Assessment: no signs of nausea or vomiting Anesthetic complications: no     Last Vitals:  Vitals:   03/17/16 1535 03/17/16 1549  BP: (!) 158/66 (!) (P) 165/55  Pulse: 70 (P) 72  Resp: 19 (P) 18  Temp: 36.3 C     Last Pain:  Vitals:   03/17/16 1549  TempSrc:   PainSc: (P) 3                  Precious Haws Genny Caulder

## 2016-03-20 ENCOUNTER — Encounter: Payer: Self-pay | Admitting: General Surgery

## 2016-03-20 ENCOUNTER — Other Ambulatory Visit: Payer: Self-pay | Admitting: General Surgery

## 2016-03-20 ENCOUNTER — Ambulatory Visit (INDEPENDENT_AMBULATORY_CARE_PROVIDER_SITE_OTHER): Payer: Medicare Other | Admitting: General Surgery

## 2016-03-20 VITALS — BP 162/90 | HR 80 | Resp 14 | Ht 60.0 in | Wt 121.0 lb

## 2016-03-20 DIAGNOSIS — C4372 Malignant melanoma of left lower limb, including hip: Secondary | ICD-10-CM

## 2016-03-20 LAB — SURGICAL PATHOLOGY

## 2016-03-20 NOTE — Patient Instructions (Signed)
Patient to return on Monday.

## 2016-03-20 NOTE — Progress Notes (Signed)
Patient ID: Angie Perez, female   DOB: 1927-07-23, 83 y.o.   MRN: [id number]  Chief Complaint  Patient presents with  . Routine Post Op    Left left melanoma excision    HPI Angie Perez is a 83 y.o. female is here today for a 3 day post op left leg melanoma excision with sentinel lymph node biopsy done on 03/17/16. Patient reports she is doing well.  HPI  Past Medical History:  Diagnosis Date  . Fuchs' syndrome II   . IBS (irritable bowel syndrome)   . Osteoporosis   . Peripheral neuropathy [facility])     Past Surgical History:  Procedure Laterality Date  . ABDOMINAL HYSTERECTOMY  1970  . CATARACT EXTRACTION    . EXCISION MELANOMA WITH SENTINEL LYMPH NODE BIOPSY Left 03/17/2016   Procedure: EXCISION MELANOMA LEFT LEG;  Surgeon: Robert Bellow, MD;  Location: [facility] ORS;  Service: General;  Laterality: Left;  . KNEE SURGERY Right 1994  . SENTINEL NODE BIOPSY Left 03/17/2016   Procedure: SENTINEL NODE BIOPSY;  Surgeon: Robert Bellow, MD;  Location: [facility] ORS;  Service: General;  Laterality: Left;  . WRIST SURGERY Right 2008    Family History  Problem Relation Age of Onset  . Cancer Mother   . Heart disease Father     Social History Social History  Substance Use Topics  . Smoking status: Never Smoker  . Smokeless tobacco: Never Used  . Alcohol use No    Allergies  Allergen Reactions  . Latex Rash  . Naproxen Nausea Only and Rash    Current Outpatient Prescriptions  Medication Sig Dispense Refill  . aspirin EC 81 MG tablet Take 81 mg by mouth daily.     [name] gabapentin (NEURONTIN) 800 MG tablet Take 800 mg by mouth at bedtime.     . raloxifene (EVISTA) 60 MG tablet Take 60 mg by mouth daily.     . sodium chloride (MURO 128) 5 % ophthalmic solution Place 1 drop into both eyes 3 (three) times daily.      No current facility-administered medications for this visit.     Review of Systems Review of Systems  Constitutional: Negative.   Respiratory:  Negative.   Cardiovascular: Negative.     Blood pressure (!) 162/90, pulse 80, resp. rate 14, height 5' (1.524 m), weight 121 lb (54.9 kg).  Physical Exam Physical Exam  Constitutional: She is oriented to person, place, and time. She appears well-developed and well-nourished.  Musculoskeletal:       Legs: Neurological: She is alert and oriented to person, place, and time.  Skin: Skin is dry.    Data Reviewed Pathology pending.  Assessment    Melanoma of the left lower extremity.    Plan    Telfa and Tegaderm dressing applied.  Patient discouraged from doing any unnecessary ambulation and keep her leg elevated when at rest.   Patient to return on Monday.   This information has been scribed by Gaspar Cola CMA.   Robert Bellow 03/20/2016, 10:40 AM

## 2016-03-24 ENCOUNTER — Ambulatory Visit (INDEPENDENT_AMBULATORY_CARE_PROVIDER_SITE_OTHER): Payer: Medicare Other | Admitting: General Surgery

## 2016-03-24 ENCOUNTER — Encounter: Payer: Self-pay | Admitting: General Surgery

## 2016-03-24 VITALS — BP 130/78 | HR 76 | Resp 14 | Ht 60.0 in | Wt 121.0 lb

## 2016-03-24 DIAGNOSIS — C4372 Malignant melanoma of left lower limb, including hip: Secondary | ICD-10-CM

## 2016-03-24 NOTE — Patient Instructions (Signed)
Return in one week.  

## 2016-03-24 NOTE — Progress Notes (Signed)
Patient ID: Angie Perez, female   DOB: 04-26-1927, 83 y.o.   MRN: [id number]  No chief complaint on file.   HPI Angie Perez is a 83 y.o. femalehere today for her follow up wide excision left leg done on 03/17/2016. Marland KitchenHPI  Past Medical History:  Diagnosis Date  . Fuchs' syndrome II   . IBS (irritable bowel syndrome)   . Osteoporosis   . Peripheral neuropathy [facility])     Past Surgical History:  Procedure Laterality Date  . ABDOMINAL HYSTERECTOMY  1970  . CATARACT EXTRACTION    . EXCISION MELANOMA WITH SENTINEL LYMPH NODE BIOPSY Left 03/17/2016   Procedure: EXCISION MELANOMA LEFT LEG;  Surgeon: Robert Bellow, MD;  Location: [facility] ORS;  Service: General;  Laterality: Left;  . KNEE SURGERY Right 1994  . SENTINEL NODE BIOPSY Left 03/17/2016   Procedure: SENTINEL NODE BIOPSY;  Surgeon: Robert Bellow, MD;  Location: [facility] ORS;  Service: General;  Laterality: Left;  . WRIST SURGERY Right 2008    Family History  Problem Relation Age of Onset  . Cancer Mother   . Heart disease Father     Social History Social History  Substance Use Topics  . Smoking status: Never Smoker  . Smokeless tobacco: Never Used  . Alcohol use No    Allergies  Allergen Reactions  . Latex Rash  . Naproxen Nausea Only and Rash    Current Outpatient Prescriptions  Medication Sig Dispense Refill  . aspirin EC 81 MG tablet Take 81 mg by mouth daily.     [name] gabapentin (NEURONTIN) 800 MG tablet Take 800 mg by mouth at bedtime.     . raloxifene (EVISTA) 60 MG tablet Take 60 mg by mouth daily.     . sodium chloride (MURO 128) 5 % ophthalmic solution Place 1 drop into both eyes 3 (three) times daily.      No current facility-administered medications for this visit.     Review of Systems Review of Systems  Constitutional: Negative.   Respiratory: Negative.   Cardiovascular: Negative.     Blood pressure 130/78, pulse 76, resp. rate 14, height 5' (1.524 m), weight 121 lb (54.9  kg).  Physical Exam Physical Exam  Musculoskeletal:       Legs:   Data Reviewed DIAGNOSIS:  A. SENTINEL LYMPH NODES 1 AND 2, LEFT SUPERFICIAL INGUINAL; EXCISION:  - NEGATIVE FOR MALIGNANCY, TWO LYMPH NODES (0/2).  - SOX10 IMMUNOHISTOCHEMISTRY (IHC) WAS PERFORMED.   B. SENTINEL LYMPH NODE, LEFT EXTERNAL ILIAC; EXCISION:  - NEGATIVE FOR MALIGNANCY, 3 LYMPH NODE FRAGMENTS.  - SOX10 IHC WAS PERFORMED.   C. SKIN AND SOFT TISSUE, LEFT POSTERIOR LATERAL LOWER EXTREMITY;  EXCISION:  - NEGATIVE FOR RESIDUAL MELANOMA.  - ULCER AT SITE OF PREVIOUS SURGERY.  - ALL MARGINS APPEAR CLEAR.    Assessment    The patient is doing well status post wide excision and sentinel node biopsy for a stage II melanoma of the left lower extremity.    Plan    The patient will begin to increase her activity as tolerated. Long periods of standing were discouraged. She may increase her walking including to the dining room.  Her case will be presented later this week at the [facility] tumor board for further recommendations.   Patient to return in one week.  This information has been scribed by Gaspar Cola CMA.  Robert Bellow 03/24/2016, 5:01 PM

## 2016-04-01 ENCOUNTER — Ambulatory Visit (INDEPENDENT_AMBULATORY_CARE_PROVIDER_SITE_OTHER): Payer: Medicare Other | Admitting: General Surgery

## 2016-04-01 ENCOUNTER — Encounter: Payer: Self-pay | Admitting: General Surgery

## 2016-04-01 VITALS — BP 150/82 | HR 68 | Resp 14 | Ht 60.0 in | Wt 121.0 lb

## 2016-04-01 DIAGNOSIS — C4372 Malignant melanoma of left lower limb, including hip: Secondary | ICD-10-CM

## 2016-04-01 NOTE — Progress Notes (Signed)
Patient ID: Angie Perez, female   DOB: 11-13-1927, 83 y.o.   MRN: [id number]  Chief Complaint  Patient presents with  . Routine Post Op    left leg  excison     HPI Angie Perez is a 83 y.o. female . Here today for her follow up left leg melanoma excision with sentinel lymph node biopsy done on 03/17/16. Patient states she is doing well.                                                                                                 HPI  Past Medical History:  Diagnosis Date  . Fuchs' syndrome II   . IBS (irritable bowel syndrome)   . Osteoporosis   . Peripheral neuropathy [facility])     Past Surgical History:  Procedure Laterality Date  . ABDOMINAL HYSTERECTOMY  1970  . CATARACT EXTRACTION    . EXCISION MELANOMA WITH SENTINEL LYMPH NODE BIOPSY Left 03/17/2016   Procedure: EXCISION MELANOMA LEFT LEG;  Surgeon: Robert Bellow, MD;  Location: [facility] ORS;  Service: General;  Laterality: Left;  . KNEE SURGERY Right 1994  . SENTINEL NODE BIOPSY Left 03/17/2016   Procedure: SENTINEL NODE BIOPSY;  Surgeon: Robert Bellow, MD;  Location: [facility] ORS;  Service: General;  Laterality: Left;  . WRIST SURGERY Right 2008    Family History  Problem Relation Age of Onset  . Cancer Mother   . Heart disease Father     Social History Social History  Substance Use Topics  . Smoking status: Never Smoker  . Smokeless tobacco: Never Used  . Alcohol use No    Allergies  Allergen Reactions  . Latex Rash  . Naproxen Nausea Only and Rash    Current Outpatient Prescriptions  Medication Sig Dispense Refill  . aspirin EC 81 MG tablet Take 81 mg by mouth daily.     [name] gabapentin (NEURONTIN) 800 MG tablet Take 800 mg by mouth at bedtime.     . raloxifene (EVISTA) 60 MG tablet Take 60 mg by mouth daily.     . sodium chloride (MURO 128) 5 % ophthalmic solution Place 1 drop into both eyes 3 (three) times daily.      No current facility-administered medications for this visit.      Review of Systems Review of Systems  Constitutional: Negative.   Respiratory: Negative.   Cardiovascular: Negative.     Blood pressure (!) 150/82, pulse 68, resp. rate 14, height 5' (1.524 m), weight 121 lb (54.9 kg).  Physical Exam Physical Exam  Constitutional: She is oriented to person, place, and time. She appears well-developed and well-nourished.  Neurological: She is alert and oriented to person, place, and time.  Skin: Skin is warm and dry.   Sutures of the wide excision site were removed.  Perhaps one-2 mL fluid in the left inguinal incision which should resolve spontaneously.  External iliac node biopsy site healing well.   Assessment    Doing well status post wide excision of a T2, N0 melanoma from the left lower extremity.    Plan  Patient's case was presented at the [facility] tumor board. No additional therapy has been recommended.    Patient to return in on e month.  This information has been scribed by Gaspar Cola CMA.   Robert Bellow 04/02/2016, 8:34 PM

## 2016-04-01 NOTE — Patient Instructions (Addendum)
Patient to return in one month. 

## 2016-04-17 ENCOUNTER — Encounter: Payer: Self-pay | Admitting: General Surgery

## 2016-04-17 ENCOUNTER — Ambulatory Visit (INDEPENDENT_AMBULATORY_CARE_PROVIDER_SITE_OTHER): Payer: Medicare Other | Admitting: General Surgery

## 2016-04-17 VITALS — BP 188/90 | HR 73 | Resp 15 | Ht 60.0 in | Wt 118.0 lb

## 2016-04-17 DIAGNOSIS — C4372 Malignant melanoma of left lower limb, including hip: Secondary | ICD-10-CM

## 2016-04-17 DIAGNOSIS — T8130XA Disruption of wound, unspecified, initial encounter: Secondary | ICD-10-CM | POA: Insufficient documentation

## 2016-04-17 NOTE — Patient Instructions (Addendum)
The patient is aware to call back for any questions or concerns.  Wash the area, pat dry and use Neosporin to the area. Keep your next appointment.  Continue to leave open during the day

## 2016-04-17 NOTE — Progress Notes (Signed)
Patient ID: Angie Perez, female   DOB: 07/27/1927, 83 y.o.   MRN: [id number]  Chief Complaint  Patient presents with  . Routine Post Op    HPI Angie Perez is a 83 y.o. female.  Here today for follow up left leg melanoma excision on 03-17-16. She wants to make sure it is healing. She reports drainage from the site and the area is still swollen. The patient reports enough drainage to cover the wound during the day. No fever or chills. She reports some pins and needles pain in the incision site.   HPI  Past Medical History:  Diagnosis Date  . Fuchs' syndrome II   . IBS (irritable bowel syndrome)   . Osteoporosis   . Peripheral neuropathy [facility])     Past Surgical History:  Procedure Laterality Date  . ABDOMINAL HYSTERECTOMY  1970  . CATARACT EXTRACTION    . EXCISION MELANOMA WITH SENTINEL LYMPH NODE BIOPSY Left 03/17/2016   Procedure: EXCISION MELANOMA LEFT LEG;  Surgeon: Robert Bellow, MD;  Location: [facility] ORS;  Service: General;  Laterality: Left;  . KNEE SURGERY Right 1994  . SENTINEL NODE BIOPSY Left 03/17/2016   Procedure: SENTINEL NODE BIOPSY;  Surgeon: Robert Bellow, MD;  Location: [facility] ORS;  Service: General;  Laterality: Left;  . WRIST SURGERY Right 2008    Family History  Problem Relation Age of Onset  . Cancer Mother   . Heart disease Father     Social History Social History  Substance Use Topics  . Smoking status: Never Smoker  . Smokeless tobacco: Never Used  . Alcohol use No    Allergies  Allergen Reactions  . Latex Rash  . Naproxen Nausea Only and Rash    Current Outpatient Prescriptions  Medication Sig Dispense Refill  . aspirin EC 81 MG tablet Take 81 mg by mouth daily.     [name] gabapentin (NEURONTIN) 800 MG tablet Take 800 mg by mouth at bedtime.     . raloxifene (EVISTA) 60 MG tablet Take 60 mg by mouth daily.     . sodium chloride (MURO 128) 5 % ophthalmic solution Place 1 drop into both eyes 3 (three) times daily.      No  current facility-administered medications for this visit.     Review of Systems Review of Systems  Constitutional: Negative.   Respiratory: Negative.   Cardiovascular: Negative.     Blood pressure (!) 188/90, pulse 73, resp. rate 15, height 5' (1.524 m), weight 118 lb (53.5 kg).  Physical Exam Physical Exam  Constitutional: She is oriented to person, place, and time. She appears well-developed and well-nourished.  Musculoskeletal:       Legs: Neurological: She is alert and oriented to person, place, and time.  Skin: Skin is warm and dry.  Left lower outer leg surgical site there is a 5 mm gap 3.5 cm long  Psychiatric: Her behavior is normal.       Assessment    Partial wound separation.    Plan    This should respond to conservative measures with daily application of Neosporin.     HPI, Physical Exam, Assessment and Plan have been scribed under the direction and in the presence of Robert Bellow, MD  Concepcion Living, LPN  I have completed the exam and reviewed the above documentation for accuracy and completeness.  I agree with the above.  Haematologist has been used and any errors in dictation or transcription are unintentional.  Dellis Filbert  Bary Castilla, M.D., F.A.C.S.  Robert Bellow 04/17/2016, 8:47 PM

## 2016-05-01 ENCOUNTER — Encounter: Payer: Self-pay | Admitting: General Surgery

## 2016-05-01 ENCOUNTER — Ambulatory Visit (INDEPENDENT_AMBULATORY_CARE_PROVIDER_SITE_OTHER): Payer: Medicare Other | Admitting: General Surgery

## 2016-05-01 VITALS — BP 146/72 | HR 66 | Resp 12 | Ht 60.0 in | Wt 119.0 lb

## 2016-05-01 DIAGNOSIS — C4372 Malignant melanoma of left lower limb, including hip: Secondary | ICD-10-CM

## 2016-05-01 DIAGNOSIS — T8130XA Disruption of wound, unspecified, initial encounter: Secondary | ICD-10-CM

## 2016-05-01 NOTE — Patient Instructions (Signed)
Return on monday

## 2016-05-01 NOTE — Progress Notes (Signed)
Patient ID: Angie Perez, female   DOB: 01-Apr-1927, 83 y.o.   MRN: [id number]  Chief Complaint  Patient presents with  . Routine Post Op    HPI Angie Perez is a 83 y.o. female.  Here today for follow up excision left lower leg melanoma. She states she is doing well, the area is a little sore. The patient reports that there is not a tremendous amount of improvement in the left calf edema after an overnight rest, nor significant worsening through the course of the day.  HPI  Past Medical History:  Diagnosis Date  . Fuchs' syndrome II   . IBS (irritable bowel syndrome)   . Osteoporosis   . Peripheral neuropathy     Past Surgical History:  Procedure Laterality Date  . ABDOMINAL HYSTERECTOMY  1970  . CATARACT EXTRACTION    . EXCISION MELANOMA WITH SENTINEL LYMPH NODE BIOPSY Left 03/17/2016   Procedure: EXCISION MELANOMA LEFT LEG;  Surgeon: Robert Bellow, MD;  Location: [facility];  Service: General;  Laterality: Left;  . KNEE SURGERY Right 1994  . SENTINEL NODE BIOPSY Left 03/17/2016   Procedure: SENTINEL NODE BIOPSY;  Surgeon: Robert Bellow, MD;  Location: [facility];  Service: General;  Laterality: Left;  . WRIST SURGERY Right 2008    Family History  Problem Relation Age of Onset  . Cancer Mother   . Heart disease Father     Social History Social History  Substance Use Topics  . Smoking status: Never Smoker  . Smokeless tobacco: Never Used  . Alcohol use No    Allergies  Allergen Reactions  . Latex Rash  . Naproxen Nausea Only and Rash    Current Outpatient Prescriptions  Medication Sig Dispense Refill  . aspirin EC 81 MG tablet Take 81 mg by mouth daily.     [name] gabapentin (NEURONTIN) 800 MG tablet Take 800 mg by mouth at bedtime.     . raloxifene (EVISTA) 60 MG tablet Take 60 mg by mouth daily.     . sodium chloride (MURO 128) 5 % ophthalmic solution Place 1 drop into both eyes 3 (three) times daily.      No current facility-administered  medications for this visit.     Review of Systems Review of Systems  Constitutional: Negative.   Respiratory: Negative.   Cardiovascular: Negative.     Blood pressure (!) 146/72, pulse 66, resp. rate 12, height 5' (1.524 m), weight 119 lb (54 kg).  Physical Exam Physical Exam  Abdominal:    Musculoskeletal:  The left calf is edematous but nontender. No popliteal tenderness. No venous cords.  Skin:       Data Reviewed The proximal two thirds of the wide excision site has disrupted. This leaves a defect 3.3 x 4.8 cm. This area was gently debrided after prep with Betadine. Culture was obtained. DuoDERM dressing followed by Kerlix and Coban applied.  Calf measurements at a level 15 cm above the superior edge of the patella: Right 48 cm, left 50 cm.  Assessment    Partial wound or sore option status post wide excision of melanoma.  Mild left lower extremity edema confined to the area of the calf, sparing of the foot. No clinical evidence of DVT.    Plan    The patient will continue to elevate her leg when possible. She has been asked to keep the dressing dry.   Return in 4 days for dressing change.   The modest edema in the  calf is likely secondary to the surgery, local inflammation with wound disruption as well as I op sees of the superficial and external iliac lymph nodes. If she does not show study improvement we'll consider ultrasound to assess for the unlikely event of deep venous thrombosis.   HPI, Physical Exam, Assessment and Plan have been scribed under the direction and in the presence of Hervey Ard, MD.  Gaspar Cola, CMA  I have completed the exam and reviewed the above documentation for accuracy and completeness.  I agree with the above.  Haematologist has been used and any errors in dictation or transcription are unintentional.  Hervey Ard, M.D., F.A.C.S.    Robert Bellow 05/02/2016, 3:19 PM

## 2016-05-05 ENCOUNTER — Ambulatory Visit (INDEPENDENT_AMBULATORY_CARE_PROVIDER_SITE_OTHER): Payer: Medicare Other | Admitting: General Surgery

## 2016-05-05 ENCOUNTER — Encounter: Payer: Self-pay | Admitting: General Surgery

## 2016-05-05 VITALS — BP 120/68 | HR 68 | Resp 14 | Ht 60.0 in | Wt 120.0 lb

## 2016-05-05 DIAGNOSIS — C4372 Malignant melanoma of left lower limb, including hip: Secondary | ICD-10-CM

## 2016-05-05 LAB — PLEASE NOTE

## 2016-05-05 LAB — ANAEROBIC AND AEROBIC CULTURE

## 2016-05-05 MED ORDER — PENICILLIN V POTASSIUM 500 MG PO TABS: 500.0000 mg | ORAL_TABLET | Freq: Four times a day (QID) | ORAL | 0 refills | Status: DC

## 2016-05-05 NOTE — Progress Notes (Signed)
Patient ID: Angie Perez, female   DOB: 07-Dec-1927, 83 y.o.   MRN: [id number]  Chief Complaint  Patient presents with  . Follow-up    left leg wound    HPI Angie Perez is a 83 y.o. female here today for her follow up left leg wound. She states the area has been draining a little. The patient tolerated the compressive wrap well. HPI  Past Medical History:  Diagnosis Date  . Fuchs' syndrome II   . IBS (irritable bowel syndrome)   . Osteoporosis   . Peripheral neuropathy     Past Surgical History:  Procedure Laterality Date  . ABDOMINAL HYSTERECTOMY  1970  . CATARACT EXTRACTION    . EXCISION MELANOMA WITH SENTINEL LYMPH NODE BIOPSY Left 03/17/2016   Procedure: EXCISION MELANOMA LEFT LEG;  Surgeon: Robert Bellow, MD;  Location: [facility];  Service: General;  Laterality: Left;  . KNEE SURGERY Right 1994  . SENTINEL NODE BIOPSY Left 03/17/2016   Procedure: SENTINEL NODE BIOPSY;  Surgeon: Robert Bellow, MD;  Location: [facility];  Service: General;  Laterality: Left;  . WRIST SURGERY Right 2008    Family History  Problem Relation Age of Onset  . Cancer Mother   . Heart disease Father     Social History Social History  Substance Use Topics  . Smoking status: Never Smoker  . Smokeless tobacco: Never Used  . Alcohol use No    Allergies  Allergen Reactions  . Latex Rash  . Naproxen Nausea Only and Rash    Current Outpatient Prescriptions  Medication Sig Dispense Refill  . aspirin EC 81 MG tablet Take 81 mg by mouth daily.     [name] gabapentin (NEURONTIN) 800 MG tablet Take 800 mg by mouth at bedtime.     . raloxifene (EVISTA) 60 MG tablet Take 60 mg by mouth daily.     . sodium chloride (MURO 128) 5 % ophthalmic solution Place 1 drop into both eyes 3 (three) times daily.     . penicillin v potassium (VEETID) 500 MG tablet Take 1 tablet (500 mg total) by mouth 4 (four) times daily. 40 tablet 0   No current facility-administered medications for this visit.      Review of Systems Review of Systems  Constitutional: Negative.   Respiratory: Negative.   Cardiovascular: Negative.     Blood pressure 120/68, pulse 68, resp. rate 14, height 5' (1.524 m), weight 120 lb (54.4 kg).  Physical Exam Physical Exam  Musculoskeletal:       Legs:   Data Reviewed Aerobic Culture Final report    Result 1 Enterococcus faecalis    Comments: Moderate growth  Antimicrobial Susceptibility Comment   Comments:   ** S = Susceptible; I = Intermediate; R = Resistant **           P = Positive; N = Negative        MICS are expressed in micrograms per mL   Antibiotic         RSLT#1  RSLT#2  RSLT#3  RSLT#4  Penicillin           S  Vancomycin           S      Assessment    Resolution of lower extremity edema with compressive wrap.  Enterococcus faecalis infection, superficial. No evidence of invasive infection.    Plan    The patient will be placed on Pen-Vee K 500 mg 4 times a day.  Considering the good progress noted over the last 4 days with DuoDERM and a compressive wrap the patient was amenable to having the dressing replaced.    Patient to return on 05/08/2016 with nurse/physician.   HPI, Physical Exam, Assessment and Plan have been scribed under the direction and in the presence of Hervey Ard, MD.  Gaspar Cola, CMA I have completed the exam and reviewed the above documentation for accuracy and completeness.  I agree with the above.  Haematologist has been used and any errors in dictation or transcription are unintentional.  Hervey Ard, M.D., F.A.C.S.  Robert Bellow 05/06/2016, 7:51 PM

## 2016-05-05 NOTE — Patient Instructions (Signed)
  Patient to return on 05/08/2016 with nurse.

## 2016-05-08 ENCOUNTER — Ambulatory Visit (INDEPENDENT_AMBULATORY_CARE_PROVIDER_SITE_OTHER): Payer: Medicare Other | Admitting: General Surgery

## 2016-05-08 VITALS — BP 116/74 | HR 66 | Resp 14 | Ht 60.0 in | Wt 119.0 lb

## 2016-05-08 DIAGNOSIS — T8130XA Disruption of wound, unspecified, initial encounter: Secondary | ICD-10-CM

## 2016-05-08 DIAGNOSIS — C4372 Malignant melanoma of left lower limb, including hip: Secondary | ICD-10-CM

## 2016-05-08 NOTE — Progress Notes (Signed)
Patient ID: Angie Perez, female   DOB: April 27, 1927, 83 y.o.   MRN: [id number]  Chief Complaint  Patient presents with  . Follow-up    HPI Angie Perez is a 83 y.o. female here today for her follow up left leg wound. She states the area began draining just prior to today's visit.   Minimal local discomfort.   The patient reports she is tolerating her Pen-Vee K therapy without ill effect. HPI  Past Medical History:  Diagnosis Date  . Fuchs' syndrome II   . IBS (irritable bowel syndrome)   . Osteoporosis   . Peripheral neuropathy     Past Surgical History:  Procedure Laterality Date  . ABDOMINAL HYSTERECTOMY  1970  . CATARACT EXTRACTION    . EXCISION MELANOMA WITH SENTINEL LYMPH NODE BIOPSY Left 03/17/2016   Procedure: EXCISION MELANOMA LEFT LEG;  Surgeon: Robert Bellow, MD;  Location: [facility];  Service: General;  Laterality: Left;  . KNEE SURGERY Right 1994  . SENTINEL NODE BIOPSY Left 03/17/2016   Procedure: SENTINEL NODE BIOPSY;  Surgeon: Robert Bellow, MD;  Location: [facility];  Service: General;  Laterality: Left;  . WRIST SURGERY Right 2008    Family History  Problem Relation Age of Onset  . Cancer Mother   . Heart disease Father     Social History Social History  Substance Use Topics  . Smoking status: Never Smoker  . Smokeless tobacco: Never Used  . Alcohol use No    Allergies  Allergen Reactions  . Latex Rash  . Naproxen Nausea Only and Rash    Current Outpatient Prescriptions  Medication Sig Dispense Refill  . aspirin EC 81 MG tablet Take 81 mg by mouth daily.     [name] gabapentin (NEURONTIN) 800 MG tablet Take 800 mg by mouth at bedtime.     . penicillin v potassium (VEETID) 500 MG tablet Take 1 tablet (500 mg total) by mouth 4 (four) times daily. 40 tablet 0  . raloxifene (EVISTA) 60 MG tablet Take 60 mg by mouth daily.     . sodium chloride (MURO 128) 5 % ophthalmic solution Place 1 drop into both eyes 3 (three) times daily.       No current facility-administered medications for this visit.     Review of Systems Review of Systems  Constitutional: Negative.   Respiratory: Negative.   Cardiovascular: Negative.     Blood pressure 116/74, pulse 66, resp. rate 14, height 5' (1.524 m), weight 119 lb (54 kg).  Physical Exam Physical Exam  Musculoskeletal:       Legs:   Data Reviewed Culture had shown enterococcus.  Assessment    Healing by secondary intent of primary excision site.    Plan    The area was cleansed and a new DuoDERM dressing followed by Tegaderm dressing applied. The compressive wrap will be removed. No residual edema. Patient encouraged to avoid long periods of standing. The patient will return for dressing change on Monday and physician exam in one week.     HPI, Physical Exam, Assessment and Plan have been scribed under the direction and in the presence of Hervey Ard, MD.  Gaspar Cola, CMA  I have completed the exam and reviewed the above documentation for accuracy and completeness.  I agree with the above.  Haematologist has been used and any errors in dictation or transcription are unintentional.  Hervey Ard, M.D., F.A.C.S.  Robert Bellow 05/08/2016, 6:37 PM

## 2016-05-08 NOTE — Patient Instructions (Addendum)
Patient to see the nurse on Monday and doctor on Thursday

## 2016-05-12 ENCOUNTER — Ambulatory Visit: Payer: Medicare Other

## 2016-05-12 DIAGNOSIS — C4372 Malignant melanoma of left lower limb, including hip: Secondary | ICD-10-CM

## 2016-05-12 NOTE — Progress Notes (Signed)
Patient ID: Angie Perez, female   DOB: Mar 12, 1927, 83 y.o.   MRN: [id number] Here for a redressing of her left leg wound.  Dressing removed and moderate amount of protein fluid drained.  The area was cleansed and a new DuoDERM dressing followed by Tegaderm dressing applied and 4x4 placed on the outside to catch any further drainage. Patient to follow up as scheduled.

## 2016-05-15 ENCOUNTER — Ambulatory Visit (INDEPENDENT_AMBULATORY_CARE_PROVIDER_SITE_OTHER): Payer: Medicare Other | Admitting: General Surgery

## 2016-05-15 ENCOUNTER — Encounter: Payer: Self-pay | Admitting: General Surgery

## 2016-05-15 VITALS — BP 158/70 | HR 76 | Resp 16 | Ht 60.0 in | Wt 119.0 lb

## 2016-05-15 DIAGNOSIS — T8130XA Disruption of wound, unspecified, initial encounter: Secondary | ICD-10-CM

## 2016-05-15 DIAGNOSIS — C4372 Malignant melanoma of left lower limb, including hip: Secondary | ICD-10-CM

## 2016-05-15 NOTE — Progress Notes (Signed)
Patient ID: Angie Perez, female   DOB: [birth year], 83 y.o.   MRN: [id number]  Chief Complaint  Patient presents with  . Follow-up    HPI Angie Perez is a 83 y.o. female.  female here today for her follow up left leg wound. She states the area still continues to drain. No pain.   HPI  Past Medical History:  Diagnosis Date  . Fuchs' syndrome II   . IBS (irritable bowel syndrome)   . Osteoporosis   . Peripheral neuropathy     Past Surgical History:  Procedure Laterality Date  . ABDOMINAL HYSTERECTOMY  1970  . CATARACT EXTRACTION    . EXCISION MELANOMA WITH SENTINEL LYMPH NODE BIOPSY Left 03/17/2016   Procedure: EXCISION MELANOMA LEFT LEG;  Surgeon: Robert Bellow, MD;  Location: [facility];  Service: General;  Laterality: Left;  . KNEE SURGERY Right 1994  . SENTINEL NODE BIOPSY Left 03/17/2016   Procedure: SENTINEL NODE BIOPSY;  Surgeon: Robert Bellow, MD;  Location: [facility];  Service: General;  Laterality: Left;  . WRIST SURGERY Right 2008    Family History  Problem Relation Age of Onset  . Cancer Mother   . Heart disease Father     Social History Social History  Substance Use Topics  . Smoking status: Never Smoker  . Smokeless tobacco: Never Used  . Alcohol use No    Allergies  Allergen Reactions  . Latex Rash  . Naproxen Nausea Only and Rash    Current Outpatient Prescriptions  Medication Sig Dispense Refill  . aspirin EC 81 MG tablet Take 81 mg by mouth daily.     [name] gabapentin (NEURONTIN) 800 MG tablet Take 800 mg by mouth at bedtime.     . penicillin v potassium (VEETID) 500 MG tablet Take 1 tablet (500 mg total) by mouth 4 (four) times daily. 40 tablet 0  . raloxifene (EVISTA) 60 MG tablet Take 60 mg by mouth daily.     . sodium chloride (MURO 128) 5 % ophthalmic solution Place 1 drop into both eyes 3 (three) times daily.      No current facility-administered medications for this visit.     Review of Systems Review of Systems   Constitutional: Negative.   Respiratory: Negative.   Cardiovascular: Negative.     Blood pressure (!) 158/70, pulse 76, resp. rate 16, height 5' (1.524 m), weight 119 lb (54 kg).  Physical Exam Physical Exam  Constitutional: She is oriented to person, place, and time. She appears well-developed and well-nourished.  Neurological: She is alert and oriented to person, place, and time.  Skin: Skin is warm and dry.  Redness improved to left lower leg,   Psychiatric: Her behavior is normal.  Mild increase in swelling with removal of compressive wrap.  Data Reviewed Wound measures 2.7 x 4.7 cm in diameter. Granulation tissue covers approximately one third of the wound base. Surface gently debrided.  Assessment    Slow improvement in left lower extremity open wound.    Plan    The patient has been very concerned with the wound drainage. She thought the wound might do better with being open to the air. I think this will impair epithelial coverage. This is simply proteinaceous fluid and not related to any residual infection. If the granulation bed continues to improve would consider allografting for early coverage.  The wound cavity was filled with alginate followed by DuoDERM and Tegaderm dressing.     HPI, Physical Exam, Assessment  and Plan have been scribed under the direction and in the presence of Robert Bellow, MD.  Karie Fetch, RN  I have completed the exam and reviewed the above documentation for accuracy and completeness.  I agree with the above.  Haematologist has been used and any errors in dictation or transcription are unintentional.  Hervey Ard, M.D., F.A.C.S.  Robert Bellow 05/16/2016, 5:07 AM

## 2016-05-19 ENCOUNTER — Ambulatory Visit (INDEPENDENT_AMBULATORY_CARE_PROVIDER_SITE_OTHER): Payer: Medicare Other | Admitting: General Surgery

## 2016-05-19 ENCOUNTER — Telehealth: Payer: Self-pay | Admitting: *Deleted

## 2016-05-19 VITALS — BP 140/70 | HR 78 | Resp 14 | Ht 60.0 in | Wt 119.0 lb

## 2016-05-19 DIAGNOSIS — C4372 Malignant melanoma of left lower limb, including hip: Secondary | ICD-10-CM

## 2016-05-19 NOTE — Telephone Encounter (Signed)
Patient called back and follow up appointment was made for next week.

## 2016-05-19 NOTE — Patient Instructions (Signed)
Follow up appointment to be announced.  

## 2016-05-19 NOTE — Telephone Encounter (Signed)
Message for patient to call the office.  

## 2016-05-19 NOTE — Progress Notes (Addendum)
Patient ID: Angie Perez, female   DOB: 08/12/1927, 83 y.o.   MRN: [id number]  Chief Complaint  Patient presents with  . Follow-up    HPI Angie Perez is a 83 y.o. female here today for her follow up left leg wound. Patient noticed  having a bad smell for the area in the last three days. HPI  Past Medical History:  Diagnosis Date  . Fuchs' syndrome II   . IBS (irritable bowel syndrome)   . Osteoporosis   . Peripheral neuropathy     Past Surgical History:  Procedure Laterality Date  . ABDOMINAL HYSTERECTOMY  1970  . CATARACT EXTRACTION    . EXCISION MELANOMA WITH SENTINEL LYMPH NODE BIOPSY Left 03/17/2016   Procedure: EXCISION MELANOMA LEFT LEG;  Surgeon: Robert Bellow, MD;  Location: [facility];  Service: General;  Laterality: Left;  . KNEE SURGERY Right 1994  . SENTINEL NODE BIOPSY Left 03/17/2016   Procedure: SENTINEL NODE BIOPSY;  Surgeon: Robert Bellow, MD;  Location: [facility];  Service: General;  Laterality: Left;  . WRIST SURGERY Right 2008    Family History  Problem Relation Age of Onset  . Cancer Mother   . Heart disease Father     Social History Social History  Substance Use Topics  . Smoking status: Never Smoker  . Smokeless tobacco: Never Used  . Alcohol use No    Allergies  Allergen Reactions  . Latex Rash  . Naproxen Nausea Only and Rash    Current Outpatient Prescriptions  Medication Sig Dispense Refill  . aspirin EC 81 MG tablet Take 81 mg by mouth daily.     [name] gabapentin (NEURONTIN) 800 MG tablet Take 800 mg by mouth at bedtime.     . raloxifene (EVISTA) 60 MG tablet Take 60 mg by mouth daily.     . sodium chloride (MURO 128) 5 % ophthalmic solution Place 1 drop into both eyes 3 (three) times daily.      No current facility-administered medications for this visit.     Review of Systems Review of Systems  Constitutional: Negative.   Respiratory: Negative.   Cardiovascular: Negative.     Blood pressure 140/70, pulse  78, resp. rate 14, height 5' (1.524 m), weight 119 lb (54 kg).  Physical Exam Physical Exam  Constitutional: She is oriented to person, place, and time. She appears well-developed and well-nourished.  Neurological: She is alert and oriented to person, place, and time.  Skin: Skin is warm and dry.    Data Reviewed Wound evaluation shows moderate drainage with faint odor. No cellulitis. The wound measured 2.8 x 4.7 cm. Minimal change from last visit. There is however now about 50% coverage of the base with granulation tissue.  Assessment    Slow healing of lower extremity wound.    Plan    Will discontinue the occlusive moist dressing and have a daily dry dressing applied by the RN [facility].    Patient to get the RN to change the bandage daily. Return in one week.  HPI, Physical Exam, Assessment and Plan have been scribed under the direction and in the presence of Hervey Ard, MD.  Gaspar Cola, CMA  I have completed the exam and reviewed the above documentation for accuracy and completeness.  I agree with the above.  Haematologist has been used and any errors in dictation or transcription are unintentional.  Hervey Ard, M.D., F.A.C.S.  Robert Bellow 05/20/2016, 8:35 PM

## 2016-05-27 ENCOUNTER — Encounter: Payer: Self-pay | Admitting: General Surgery

## 2016-05-27 ENCOUNTER — Ambulatory Visit (INDEPENDENT_AMBULATORY_CARE_PROVIDER_SITE_OTHER): Payer: Medicare Other | Admitting: General Surgery

## 2016-05-27 VITALS — BP 126/62 | HR 76 | Resp 12 | Ht 60.0 in | Wt 118.0 lb

## 2016-05-27 DIAGNOSIS — C4372 Malignant melanoma of left lower limb, including hip: Secondary | ICD-10-CM

## 2016-05-27 NOTE — Progress Notes (Signed)
Patient ID: Angie Perez, female   DOB: 07/14/1927, 83 y.o.   MRN: [id number]  Chief Complaint  Patient presents with  . Follow-up    HPI Angie Perez is a 83 y.o. female here today for her follow up left leg check.Patient states the area is doing better. She has it cleaned and rewrapped daily.  HPI  Past Medical History:  Diagnosis Date  . Fuchs' syndrome II   . IBS (irritable bowel syndrome)   . Osteoporosis   . Peripheral neuropathy     Past Surgical History:  Procedure Laterality Date  . ABDOMINAL HYSTERECTOMY  1970  . CATARACT EXTRACTION    . EXCISION MELANOMA WITH SENTINEL LYMPH NODE BIOPSY Left 03/17/2016   Procedure: EXCISION MELANOMA LEFT LEG;  Surgeon: Robert Bellow, MD;  Location: [facility];  Service: General;  Laterality: Left;  . KNEE SURGERY Right 1994  . SENTINEL NODE BIOPSY Left 03/17/2016   Procedure: SENTINEL NODE BIOPSY;  Surgeon: Robert Bellow, MD;  Location: [facility];  Service: General;  Laterality: Left;  . WRIST SURGERY Right 2008    Family History  Problem Relation Age of Onset  . Cancer Mother   . Heart disease Father     Social History Social History  Substance Use Topics  . Smoking status: Never Smoker  . Smokeless tobacco: Never Used  . Alcohol use No    Allergies  Allergen Reactions  . Latex Rash  . Naproxen Nausea Only and Rash    Current Outpatient Prescriptions  Medication Sig Dispense Refill  . aspirin EC 81 MG tablet Take 81 mg by mouth daily.     [name] gabapentin (NEURONTIN) 800 MG tablet Take 800 mg by mouth at bedtime.     . raloxifene (EVISTA) 60 MG tablet Take 60 mg by mouth daily.     . sodium chloride (MURO 128) 5 % ophthalmic solution Place 1 drop into both eyes 3 (three) times daily.      No current facility-administered medications for this visit.     Review of Systems Review of Systems  Constitutional: Negative.   Respiratory: Negative.   Cardiovascular: Negative.     Blood pressure 126/62,  pulse 76, resp. rate 12, height 5' (1.524 m), weight 118 lb (53.5 kg).  Physical Exam Physical Exam  Constitutional: She is oriented to person, place, and time. She appears well-developed and well-nourished.  Neurological: She is alert and oriented to person, place, and time.  Left lower extremity shows a 2.6 x 4.5 cm wound that is without surrounding erythema, odor or drainage. Nontender. Granulation tissue now covers 50% of the wound.  Improvement in left calf edema from last visit.    Assessment    Slow improvement in left lower extremity wound status post wide excision of melanoma.  Improvement in lower extremity edema.    Plan    The patient was encouraged to shower before she goes down to the nursing station for her daily dressing changes.      Will  Continue to  have a daily dry dressing applied by the RN at her retirement community. Return in two weeks.   HPI, Physical Exam, Assessment and Plan have been scribed under the direction and in the presence of Hervey Ard, MD.  I have completed the exam and reviewed the above documentation for accuracy and completeness.  I agree with the above.  Haematologist has been used and any errors in dictation or transcription are unintentional.  Hervey Ard,  M.D., F.A.C.S.   Gaspar Cola, CMA  Robert Bellow 05/28/2016, 8:04 PM

## 2016-05-27 NOTE — Patient Instructions (Signed)
Return in two weeks.  

## 2016-06-17 ENCOUNTER — Ambulatory Visit (INDEPENDENT_AMBULATORY_CARE_PROVIDER_SITE_OTHER): Payer: Medicare Other | Admitting: General Surgery

## 2016-06-17 ENCOUNTER — Encounter: Payer: Self-pay | Admitting: General Surgery

## 2016-06-17 VITALS — BP 158/78 | HR 85 | Resp 13 | Ht 60.0 in | Wt 118.0 lb

## 2016-06-17 DIAGNOSIS — C4372 Malignant melanoma of left lower limb, including hip: Secondary | ICD-10-CM

## 2016-06-17 NOTE — Patient Instructions (Signed)
Return in one month.  

## 2016-06-17 NOTE — Progress Notes (Signed)
Patient ID: Angie Perez, female   DOB: 1927/07/31, 83 y.o.   MRN: [id number]  Chief Complaint  Patient presents with  . Follow-up    HPI Angie Perez is a 83 y.o. female here today for two week follow up left leg check.Patient states the area is doing better. She has it cleaned and rewrapped daily.    Marland KitchenHPI  Past Medical History:  Diagnosis Date  . Fuchs' syndrome II   . IBS (irritable bowel syndrome)   . Osteoporosis   . Peripheral neuropathy     Past Surgical History:  Procedure Laterality Date  . ABDOMINAL HYSTERECTOMY  1970  . CATARACT EXTRACTION    . EXCISION MELANOMA WITH SENTINEL LYMPH NODE BIOPSY Left 03/17/2016   Procedure: EXCISION MELANOMA LEFT LEG;  Surgeon: Robert Bellow, MD;  Location: [facility];  Service: General;  Laterality: Left;  . KNEE SURGERY Right 1994  . SENTINEL NODE BIOPSY Left 03/17/2016   Procedure: SENTINEL NODE BIOPSY;  Surgeon: Robert Bellow, MD;  Location: [facility];  Service: General;  Laterality: Left;  . WRIST SURGERY Right 2008    Family History  Problem Relation Age of Onset  . Cancer Mother   . Heart disease Father     Social History Social History  Substance Use Topics  . Smoking status: Never Smoker  . Smokeless tobacco: Never Used  . Alcohol use No    Allergies  Allergen Reactions  . Latex Rash  . Naproxen Nausea Only and Rash    Current Outpatient Prescriptions  Medication Sig Dispense Refill  . aspirin EC 81 MG tablet Take 81 mg by mouth daily.     [name] gabapentin (NEURONTIN) 800 MG tablet Take 800 mg by mouth at bedtime.     . raloxifene (EVISTA) 60 MG tablet Take 60 mg by mouth daily.     . sodium chloride (MURO 128) 5 % ophthalmic solution Place 1 drop into both eyes 3 (three) times daily.      No current facility-administered medications for this visit.     Review of Systems Review of Systems  Constitutional: Negative.   Respiratory: Negative.   Cardiovascular: Negative.     Blood  pressure (!) 158/78, pulse 85, resp. rate 13, height 5' (1.524 m), weight 118 lb (53.5 kg).  Physical Exam Physical Exam  Constitutional: She is oriented to person, place, and time. She appears well-developed and well-nourished.  Neurological: She is alert and oriented to person, place, and time.  Left lower extremity shows a 2.4 x 4.4 cm wound that is without surrounding erythema, odor or drainage. Approximate 50% granulation bed coverage. No surrounding erythema. Marked improvement in previously noted lower extremity edema. Nontender     Assessment    Slow healing of left lower extremity wide excision site wound.    Plan    The patient is doing well with home dressing changes of a dampened 4 x 4 gauze applied daily.     Patient tot return in one month.  HPI, Physical Exam, Assessment and Plan have been scribed under the direction and in the presence of Hervey Ard, MD.  Gaspar Cola, CMA  I have completed the exam and reviewed the above documentation for accuracy and completeness.  I agree with the above.  Haematologist has been used and any errors in dictation or transcription are unintentional.  Hervey Ard, M.D., F.A.C.S.  Robert Bellow 06/17/2016, 10:04 PM

## 2016-07-15 ENCOUNTER — Encounter: Payer: Self-pay | Admitting: General Surgery

## 2016-07-15 ENCOUNTER — Ambulatory Visit: Payer: Medicare Other | Admitting: General Surgery

## 2016-07-15 VITALS — BP 164/68 | HR 72 | Resp 14 | Ht 60.0 in | Wt 119.0 lb

## 2016-07-15 DIAGNOSIS — T8130XD Disruption of wound, unspecified, subsequent encounter: Secondary | ICD-10-CM

## 2016-07-15 DIAGNOSIS — C4372 Malignant melanoma of left lower limb, including hip: Secondary | ICD-10-CM

## 2016-07-15 NOTE — Patient Instructions (Signed)
Patient to return in one month. The patient is aware to call back for any questions or concerns. 

## 2016-07-15 NOTE — Progress Notes (Signed)
Patient ID: Angie Perez, female   DOB: January 11, 1928, 83 y.o.   MRN: [id number]  Chief Complaint  Patient presents with  . Follow-up    HPI Angie Perez is a 83 y.o. female.  here today for two week follow upleft leg check. Patient states the area has been oozing, bloody drainage for 2 weeks.  HPI  Past Medical History:  Diagnosis Date  . Fuchs' syndrome II   . IBS (irritable bowel syndrome)   . Osteoporosis   . Peripheral neuropathy     Past Surgical History:  Procedure Laterality Date  . ABDOMINAL HYSTERECTOMY  1970  . CATARACT EXTRACTION    . EXCISION MELANOMA WITH SENTINEL LYMPH NODE BIOPSY Left 03/17/2016   Procedure: EXCISION MELANOMA LEFT LEG;  Surgeon: Robert Bellow, MD;  Location: [facility];  Service: General;  Laterality: Left;  . KNEE SURGERY Right 1994  . SENTINEL NODE BIOPSY Left 03/17/2016   Procedure: SENTINEL NODE BIOPSY;  Surgeon: Robert Bellow, MD;  Location: [facility];  Service: General;  Laterality: Left;  . WRIST SURGERY Right 2008    Family History  Problem Relation Age of Onset  . Cancer Mother   . Heart disease Father     Social History Social History  Substance Use Topics  . Smoking status: Never Smoker  . Smokeless tobacco: Never Used  . Alcohol use No    Allergies  Allergen Reactions  . Latex Rash  . Naproxen Nausea Only and Rash    Current Outpatient Prescriptions  Medication Sig Dispense Refill  . aspirin EC 81 MG tablet Take 81 mg by mouth daily.     [name] gabapentin (NEURONTIN) 800 MG tablet Take 800 mg by mouth at bedtime.     . raloxifene (EVISTA) 60 MG tablet Take 60 mg by mouth daily.     . sodium chloride (MURO 128) 5 % ophthalmic solution Place 1 drop into both eyes 3 (three) times daily.      No current facility-administered medications for this visit.     Review of Systems Review of Systems  Constitutional: Negative.   Respiratory: Negative.   Cardiovascular: Negative.     Blood pressure (!)  164/68, pulse 72, resp. rate 14, height 5' (1.524 m), weight 119 lb (54 kg).  Physical Exam Physical Exam  Constitutional: She is oriented to person, place, and time. She appears well-developed and well-nourished.  Musculoskeletal:       Legs: Neurological: She is alert and oriented to person, place, and time.  Skin: Skin is warm and dry.  Left lower extremity shows a 2.2 x 4.2cm wound. Silver nitrate the area.    Assessment.    Steady progress in wound healing by secondary intent.    Plan    The patient was reassured that the drainage is normal for granulation tissue.  Instructed to wash the area without addressing daily and apply  dampened gauze daily.     Patient to return in one month. The patient is aware to call back for any questions or concerns.   HPI, Physical Exam, Assessment and Plan have been scribed under the direction and in the presence of Hervey Ard, MD.  Gaspar Cola, CMA  I have completed the exam and reviewed the above documentation for accuracy and completeness.  I agree with the above.  Haematologist has been used and any errors in dictation or transcription are unintentional.  Hervey Ard, M.D., F.A.C.S.   Robert Bellow 07/15/2016, 9:24 PM

## 2016-08-13 ENCOUNTER — Ambulatory Visit (INDEPENDENT_AMBULATORY_CARE_PROVIDER_SITE_OTHER): Payer: Medicare Other | Admitting: General Surgery

## 2016-08-13 ENCOUNTER — Encounter: Payer: Self-pay | Admitting: General Surgery

## 2016-08-13 VITALS — BP 130/68 | HR 68 | Resp 12 | Ht 60.0 in | Wt 119.0 lb

## 2016-08-13 DIAGNOSIS — C4372 Malignant melanoma of left lower limb, including hip: Secondary | ICD-10-CM

## 2016-08-13 NOTE — Progress Notes (Signed)
Patient ID: Angie Perez, female   DOB: 08/13/1927, 83 y.o.   MRN: [id number]  Chief Complaint  Patient presents with  . Follow-up    HPI Angie Perez is a 83 y.o. female here today for her follow up melanoma left lower leg. Patient states the area is doing well, still draining some.  HPI  Past Medical History:  Diagnosis Date  . Fuchs' syndrome II   . IBS (irritable bowel syndrome)   . Osteoporosis   . Peripheral neuropathy     Past Surgical History:  Procedure Laterality Date  . ABDOMINAL HYSTERECTOMY  1970  . CATARACT EXTRACTION    . EXCISION MELANOMA WITH SENTINEL LYMPH NODE BIOPSY Left 03/17/2016   Procedure: EXCISION MELANOMA LEFT LEG;  Surgeon: Robert Bellow, MD;  Location: [facility];  Service: General;  Laterality: Left;  . KNEE SURGERY Right 1994  . SENTINEL NODE BIOPSY Left 03/17/2016   Procedure: SENTINEL NODE BIOPSY;  Surgeon: Robert Bellow, MD;  Location: [facility];  Service: General;  Laterality: Left;  . WRIST SURGERY Right 2008    Family History  Problem Relation Age of Onset  . Cancer Mother   . Heart disease Father     Social History Social History  Substance Use Topics  . Smoking status: Never Smoker  . Smokeless tobacco: Never Used  . Alcohol use No    Allergies  Allergen Reactions  . Latex Rash  . Naproxen Nausea Only and Rash    Current Outpatient Prescriptions  Medication Sig Dispense Refill  . aspirin EC 81 MG tablet Take 81 mg by mouth daily.     [name] gabapentin (NEURONTIN) 800 MG tablet Take 800 mg by mouth at bedtime.     . raloxifene (EVISTA) 60 MG tablet Take 60 mg by mouth daily.     . sodium chloride (MURO 128) 5 % ophthalmic solution Place 1 drop into both eyes 3 (three) times daily.      No current facility-administered medications for this visit.     Review of Systems Review of Systems  Constitutional: Negative.   Respiratory: Negative.   Cardiovascular: Negative.     Blood pressure 130/68, pulse 68,  resp. rate 12, height 5' (1.524 m), weight 119 lb (54 kg).  Physical Exam Physical Exam  Constitutional: She is oriented to person, place, and time. She appears well-developed and well-nourished.  Musculoskeletal:       Legs: Neurological: She is alert and oriented to person, place, and time.  Skin: Skin is warm and dry.   1.8 by 3.2 cm wound lower left leg.  Previously 2.2 x 4.2 cm.     Assessment    Slow epithelialization of all normal wide excision site.    Plan    Patient is asymptomatic and doing all her regular activities. We'll continue local wound care.     Instructed to wash the area without addressing daily and apply  dampened gauze daily.     Patient to return in one month. The patient is aware to call back for any questions or concerns.  HPI, Physical Exam, Assessment and Plan have been scribed under the direction and in the presence of Angie Ard, MD.  Angie Perez, CMA   I have completed the exam and reviewed the above documentation for accuracy and completeness.  I agree with the above.  Haematologist has been used and any errors in dictation or transcription are unintentional.  Angie Perez, M.D., F.A.C.S.  Robert Bellow  08/13/2016, 9:49 PM

## 2016-08-13 NOTE — Patient Instructions (Signed)
Instructed to wash the area without addressing daily and apply  dampened gauze daily.     Patient to return in one month. The patient is aware to call back for any questions or concerns.

## 2016-09-17 ENCOUNTER — Ambulatory Visit (INDEPENDENT_AMBULATORY_CARE_PROVIDER_SITE_OTHER): Payer: Medicare Other | Admitting: General Surgery

## 2016-09-17 ENCOUNTER — Encounter: Payer: Self-pay | Admitting: General Surgery

## 2016-09-17 VITALS — BP 122/70 | HR 68 | Resp 12 | Ht 60.0 in | Wt 120.0 lb

## 2016-09-17 DIAGNOSIS — C4372 Malignant melanoma of left lower limb, including hip: Secondary | ICD-10-CM

## 2016-09-17 NOTE — Patient Instructions (Signed)
Patient to return in one month. The patient is aware to call back for any questions or concerns. 

## 2016-09-17 NOTE — Progress Notes (Signed)
Patient ID: Angie Perez, female   DOB: 1927-11-02, 83 y.o.   MRN: [id number]  Chief Complaint  Patient presents with  . Follow-up    HPI Angie Perez is a 83 y.o. female here today for her follow up left leg meloma. Patient states the area is doing much better.  HPI  Past Medical History:  Diagnosis Date  . Fuchs' syndrome II   . IBS (irritable bowel syndrome)   . Osteoporosis   . Peripheral neuropathy     Past Surgical History:  Procedure Laterality Date  . ABDOMINAL HYSTERECTOMY  1970  . CATARACT EXTRACTION    . EXCISION MELANOMA WITH SENTINEL LYMPH NODE BIOPSY Left 03/17/2016   Procedure: EXCISION MELANOMA LEFT LEG;  Surgeon: Robert Bellow, MD;  Location: [facility];  Service: General;  Laterality: Left;  . KNEE SURGERY Right 1994  . SENTINEL NODE BIOPSY Left 03/17/2016   Procedure: SENTINEL NODE BIOPSY;  Surgeon: Robert Bellow, MD;  Location: [facility];  Service: General;  Laterality: Left;  . WRIST SURGERY Right 2008    Family History  Problem Relation Age of Onset  . Cancer Mother   . Heart disease Father     Social History Social History  Substance Use Topics  . Smoking status: Never Smoker  . Smokeless tobacco: Never Used  . Alcohol use No    Allergies  Allergen Reactions  . Latex Rash  . Naproxen Nausea Only and Rash    Current Outpatient Prescriptions  Medication Sig Dispense Refill  . aspirin EC 81 MG tablet Take 81 mg by mouth daily.     [name] gabapentin (NEURONTIN) 800 MG tablet Take 800 mg by mouth at bedtime.     . raloxifene (EVISTA) 60 MG tablet Take 60 mg by mouth daily.     . sodium chloride (MURO 128) 5 % ophthalmic solution Place 1 drop into both eyes 3 (three) times daily.      No current facility-administered medications for this visit.     Review of Systems Review of Systems  Constitutional: Negative.   Respiratory: Negative.   Cardiovascular: Negative.     Blood pressure 122/70, pulse 68, resp. rate 12, height  5' (1.524 m), weight 120 lb (54.4 kg).  Physical Exam Physical Exam  Constitutional: She is oriented to person, place, and time. She appears well-developed and well-nourished.  Neurological: She is alert and oriented to person, place, and time.  Skin: Skin is warm and dry.    1 1/2 by 2.8 cm wound lower left leg. This area previously measured 1.8 x 3.2 cm.  Silver nitrate applied to wound base.      Assessment    Slow healing of left distal calf wound post melanoma excision.    Plan    Patient is planning on taking a trip to visit her sister in [place], [place]. No contraindication to travel.    Patient to return in one month. The patient is aware to call back for any questions or concerns.   HPI, Physical Exam, Assessment and Plan have been scribed under the direction and in the presence of Hervey Ard, MD.  Gaspar Cola, CMA  I have completed the exam and reviewed the above documentation for accuracy and completeness.  I agree with the above.  Haematologist has been used and any errors in dictation or transcription are unintentional.  Hervey Ard, M.D., F.A.C.S.  Robert Bellow 09/17/2016, 9:07 PM

## 2016-09-25 ENCOUNTER — Telehealth: Payer: Self-pay | Admitting: *Deleted

## 2016-09-25 NOTE — Telephone Encounter (Signed)
She called to let us know the nurse wanted her to call us to let us know there is a new area in ht e center of her wound. No bleeding. Measures 1 cm x .5 x .5 and firm. appointment made for Monday at 1 due to transportation issues.

## 2016-09-29 ENCOUNTER — Ambulatory Visit: Payer: Medicare Other | Admitting: General Surgery

## 2016-10-15 ENCOUNTER — Ambulatory Visit (INDEPENDENT_AMBULATORY_CARE_PROVIDER_SITE_OTHER): Payer: Medicare Other | Admitting: General Surgery

## 2016-10-15 ENCOUNTER — Encounter: Payer: Self-pay | Admitting: General Surgery

## 2016-10-15 VITALS — BP 120/70 | HR 90 | Resp 18 | Ht 60.0 in | Wt 118.0 lb

## 2016-10-15 DIAGNOSIS — C4372 Malignant melanoma of left lower limb, including hip: Secondary | ICD-10-CM

## 2016-10-15 NOTE — Patient Instructions (Signed)
Return in ine month.

## 2016-10-15 NOTE — Progress Notes (Signed)
Patient ID: Angie Perez, female   DOB: June 16, 1927, 83 y.o.   MRN: [id number]  Chief Complaint  Patient presents with  . Follow-up    HPI Angie Perez is a 83 y.o. female.  here today for her follow up left leg meloma. Patient states the area seems to be doing the same to her.  HPI  Past Medical History:  Diagnosis Date  . Fuchs' syndrome II   . IBS (irritable bowel syndrome)   . Osteoporosis   . Peripheral neuropathy     Past Surgical History:  Procedure Laterality Date  . ABDOMINAL HYSTERECTOMY  1970  . CATARACT EXTRACTION    . EXCISION MELANOMA WITH SENTINEL LYMPH NODE BIOPSY Left 03/17/2016   Procedure: EXCISION MELANOMA LEFT LEG;  Surgeon: Robert Bellow, MD;  Location: [facility];  Service: General;  Laterality: Left;  . KNEE SURGERY Right 1994  . SENTINEL NODE BIOPSY Left 03/17/2016   Procedure: SENTINEL NODE BIOPSY;  Surgeon: Robert Bellow, MD;  Location: [facility];  Service: General;  Laterality: Left;  . WRIST SURGERY Right 2008    Family History  Problem Relation Age of Onset  . Cancer Mother   . Heart disease Father     Social History Social History  Substance Use Topics  . Smoking status: Never Smoker  . Smokeless tobacco: Never Used  . Alcohol use No    Allergies  Allergen Reactions  . Latex Rash  . Naproxen Nausea Only and Rash    Current Outpatient Prescriptions  Medication Sig Dispense Refill  . aspirin EC 81 MG tablet Take 81 mg by mouth daily.     [name] gabapentin (NEURONTIN) 800 MG tablet Take 800 mg by mouth at bedtime.     . raloxifene (EVISTA) 60 MG tablet Take 60 mg by mouth daily.     . sodium chloride (MURO 128) 5 % ophthalmic solution Place 1 drop into both eyes 3 (three) times daily.      No current facility-administered medications for this visit.     Review of Systems Review of Systems  Constitutional: Negative.   Respiratory: Negative.   Cardiovascular: Negative.     Blood pressure 120/70, pulse 90, resp.  rate 18, height 5' (1.524 m), weight 118 lb (53.5 kg).  Physical Exam Physical Exam  1 1/2 by  2.8 cm wound lower left leg, dimensions the same, but the wound is now level with the adjacent skin. Treated with silver nitrate.    Assessment    Slow healing by secondary intent left lower extremity wound. Resolution of majority previous edema.    Plan    Despite this will continue to improve with time. The patient is having no pain and no limitation in activities.     HPI, Physical Exam, Assessment and Plan have been scribed under the direction and in the presence of Robert Bellow, MD. Karie Fetch, RN  I have completed the exam and reviewed the above documentation for accuracy and completeness.  I agree with the above.  Haematologist has been used and any errors in dictation or transcription are unintentional.  Hervey Ard, M.D., F.A.C.S.   Robert Bellow 10/15/2016, 9:22 PM

## 2016-11-18 ENCOUNTER — Ambulatory Visit (INDEPENDENT_AMBULATORY_CARE_PROVIDER_SITE_OTHER): Payer: Medicare Other | Admitting: General Surgery

## 2016-11-18 ENCOUNTER — Encounter: Payer: Self-pay | Admitting: General Surgery

## 2016-11-18 VITALS — BP 120/70 | HR 72 | Resp 14 | Ht 59.0 in | Wt 118.0 lb

## 2016-11-18 DIAGNOSIS — C4372 Malignant melanoma of left lower limb, including hip: Secondary | ICD-10-CM

## 2016-11-18 NOTE — Progress Notes (Signed)
Patient ID: Angie Perez, female   DOB: 09-Aug-1927, 83 y.o.   MRN: [id number]  Chief Complaint  Patient presents with  . Follow-up    HPI Angie Perez is a 83 y.o. female.  Here for follow up left leg melanoma.Patient states the area is doing well.   HPI  Past Medical History:  Diagnosis Date  . Fuchs' syndrome II   . IBS (irritable bowel syndrome)   . Osteoporosis   . Peripheral neuropathy     Past Surgical History:  Procedure Laterality Date  . ABDOMINAL HYSTERECTOMY  1970  . CATARACT EXTRACTION    . KNEE SURGERY Right 1994  . WRIST SURGERY Right 2008    Family History  Problem Relation Age of Onset  . Cancer Mother   . Heart disease Father     Social History Social History   Tobacco Use  . Smoking status: Never Smoker  . Smokeless tobacco: Never Used  Substance Use Topics  . Alcohol use: No    Alcohol/week: 0.0 oz  . Drug use: No    Allergies  Allergen Reactions  . Latex Rash  . Naproxen Nausea Only and Rash    Current Outpatient Medications  Medication Sig Dispense Refill  . aspirin EC 81 MG tablet Take 81 mg by mouth daily.     [name] gabapentin (NEURONTIN) 800 MG tablet Take 800 mg by mouth at bedtime.     . raloxifene (EVISTA) 60 MG tablet Take 60 mg by mouth daily.     . sodium chloride (MURO 128) 5 % ophthalmic solution Place 1 drop into both eyes 3 (three) times daily.      No current facility-administered medications for this visit.     Review of Systems Review of Systems  Constitutional: Negative.   Respiratory: Negative.   Cardiovascular: Negative.     Blood pressure 120/70, pulse 72, resp. rate 14, height 4\' 11"  (1.499 m), weight 118 lb (53.5 kg).  Physical Exam Physical Exam  Constitutional: She is oriented to person, place, and time. She appears well-developed.  Musculoskeletal:       Legs: Neurological: She is alert and oriented to person, place, and time.  Skin: Skin is warm and dry.   1 by 1.5 cm diameter  area of residual granulation tissue. Previously 1.5 x 2.8 cm. Significant improvement over the last month. Silver nitrate applied to the wound.     Assessment    Gradual resolution of wound dehiscence.     Plan         Patient to return in one month. The patient is aware to call back for any questions or concerns.   HPI, Physical Exam, Assessment and Plan have been scribed under the direction and in the presence of Hervey Ard, MD.  Gaspar Cola, CMA  I have completed the exam and reviewed the above documentation for accuracy and completeness.  I agree with the above.  Haematologist has been used and any errors in dictation or transcription are unintentional.  Hervey Ard, M.D., F.A.C.S.  Robert Bellow 11/19/2016, 7:43 PM

## 2016-11-18 NOTE — Patient Instructions (Addendum)
Patient to return in one month. The patient is aware to call back for any questions or concerns. 

## 2016-12-16 ENCOUNTER — Ambulatory Visit (INDEPENDENT_AMBULATORY_CARE_PROVIDER_SITE_OTHER): Payer: Medicare Other | Admitting: General Surgery

## 2016-12-16 ENCOUNTER — Encounter: Payer: Self-pay | Admitting: General Surgery

## 2016-12-16 VITALS — BP 120/68 | HR 72 | Resp 13 | Ht 60.0 in | Wt 119.0 lb

## 2016-12-16 DIAGNOSIS — C4372 Malignant melanoma of left lower limb, including hip: Secondary | ICD-10-CM

## 2016-12-16 NOTE — Patient Instructions (Signed)
Patient to return in one month. The patient is aware to call back for any questions or concerns. 

## 2016-12-16 NOTE — Progress Notes (Signed)
Patient ID: Angie Perez, female   DOB: Feb 27, 1927, 83 y.o.   MRN: [id number]  Chief Complaint  Patient presents with  . Follow-up    HPI Angie Perez is a 83 y.o. female here today for her one month follow up left leg melanoma. Patient states she is doing well.                                                  tHPI  Past Medical History:  Diagnosis Date  . Fuchs' syndrome II   . IBS (irritable bowel syndrome)   . Osteoporosis   . Peripheral neuropathy     Past Surgical History:  Procedure Laterality Date  . ABDOMINAL HYSTERECTOMY  1970  . CATARACT EXTRACTION    . EXCISION MELANOMA WITH SENTINEL LYMPH NODE BIOPSY Left 03/17/2016   Procedure: EXCISION MELANOMA LEFT LEG;  Surgeon: Robert Bellow, MD;  Location: [facility];  Service: General;  Laterality: Left;  . KNEE SURGERY Right 1994  . SENTINEL NODE BIOPSY Left 03/17/2016   Procedure: SENTINEL NODE BIOPSY;  Surgeon: Robert Bellow, MD;  Location: [facility];  Service: General;  Laterality: Left;  . WRIST SURGERY Right 2008    Family History  Problem Relation Age of Onset  . Cancer Mother   . Heart disease Father     Social History Social History   Tobacco Use  . Smoking status: Never Smoker  . Smokeless tobacco: Never Used  Substance Use Topics  . Alcohol use: No    Alcohol/week: 0.0 oz  . Drug use: No    Allergies  Allergen Reactions  . Latex Rash  . Naproxen Nausea Only and Rash    Current Outpatient Medications  Medication Sig Dispense Refill  . aspirin EC 81 MG tablet Take 81 mg by mouth daily.     [name] gabapentin (NEURONTIN) 800 MG tablet Take 800 mg by mouth at bedtime.     . raloxifene (EVISTA) 60 MG tablet Take 60 mg by mouth daily.     . sodium chloride (MURO 128) 5 % ophthalmic solution Place 1 drop into both eyes 3 (three) times daily.      No current facility-administered medications for this visit.     Review of Systems Review of Systems  Constitutional: Negative.    Respiratory: Negative.   Cardiovascular: Negative.     Blood pressure 120/68, pulse 72, resp. rate 13, height 5' (1.524 m), weight 119 lb (54 kg).  Physical Exam Physical Exam  Constitutional: She is oriented to person, place, and time. She appears well-developed and well-nourished.  Neurological: She is alert and oriented to person, place, and time.  Skin: Skin is warm and dry.  Left leg 1 by 1.5 cm  Left inguinal incision well healed.  Data Reviewed 1.0 x 1.5 cm at her last visit. Clean based, shallow. Treated with silver nitrate. No interval change over the past month.  Assessment    Slow healing wound dehiscence post wide excision melanoma left lower extremity.    Plan    This is a first time the patient showed no improvement in the size of the defect. She is asymptomatic and having no difficulty with wound care. We'll continue simpledressing changes with Vaseline/bacitracin application.      Patient to return in one month. The patient is aware to call back  for any questions or concerns.   HPI, Physical Exam, Assessment and Plan have been scribed under the direction and in the presence of Hervey Ard, MD.  Gaspar Cola, CMA  I have completed the exam and reviewed the above documentation for accuracy and completeness.  I agree with the above.  Haematologist has been used and any errors in dictation or transcription are unintentional.  Hervey Ard, M.D., F.A.C.S.  Robert Bellow 12/16/2016, 9:24 PM

## 2017-01-15 ENCOUNTER — Ambulatory Visit: Payer: Medicare Other | Admitting: General Surgery

## 2017-01-22 ENCOUNTER — Encounter: Payer: Self-pay | Admitting: General Surgery

## 2017-01-22 ENCOUNTER — Ambulatory Visit (INDEPENDENT_AMBULATORY_CARE_PROVIDER_SITE_OTHER): Payer: Medicare Other | Admitting: General Surgery

## 2017-01-22 VITALS — BP 154/68 | HR 84 | Resp 12 | Ht 59.0 in | Wt 122.0 lb

## 2017-01-22 DIAGNOSIS — T8130XD Disruption of wound, unspecified, subsequent encounter: Secondary | ICD-10-CM

## 2017-01-22 DIAGNOSIS — C4372 Malignant melanoma of left lower limb, including hip: Secondary | ICD-10-CM

## 2017-01-22 NOTE — Patient Instructions (Signed)
The patient is aware to call back for any questions or concerns.  

## 2017-01-22 NOTE — Progress Notes (Signed)
Patient ID: Angie Perez, female   DOB: Apr 24, 1927, 84 y.o.   MRN: [id number]  Chief Complaint  Patient presents with  . Follow-up    HPI Angie Perez is a 84 y.o. female here today for her follow up left legmelanoma. Patient states she is doing well.  She is recovering from a head cold.  HPI  Past Medical History:  Diagnosis Date  . Fuchs' syndrome II   . IBS (irritable bowel syndrome)   . Osteoporosis   . Peripheral neuropathy     Past Surgical History:  Procedure Laterality Date  . ABDOMINAL HYSTERECTOMY  1970  . CATARACT EXTRACTION    . EXCISION MELANOMA WITH SENTINEL LYMPH NODE BIOPSY Left 03/17/2016   Procedure: EXCISION MELANOMA LEFT LEG;  Surgeon: Robert Bellow, MD;  Location: [facility];  Service: General;  Laterality: Left;  . KNEE SURGERY Right 1994  . SENTINEL NODE BIOPSY Left 03/17/2016   Procedure: SENTINEL NODE BIOPSY;  Surgeon: Robert Bellow, MD;  Location: [facility];  Service: General;  Laterality: Left;  . WRIST SURGERY Right 2008    Family History  Problem Relation Age of Onset  . Cancer Mother   . Heart disease Father     Social History Social History   Tobacco Use  . Smoking status: Never Smoker  . Smokeless tobacco: Never Used  Substance Use Topics  . Alcohol use: No    Alcohol/week: 0.0 oz  . Drug use: No    Allergies  Allergen Reactions  . Latex Rash  . Naproxen Nausea Only and Rash    Current Outpatient Medications  Medication Sig Dispense Refill  . aspirin EC 81 MG tablet Take 81 mg by mouth daily.     [name] gabapentin (NEURONTIN) 800 MG tablet Take 800 mg by mouth at bedtime.     . raloxifene (EVISTA) 60 MG tablet Take 60 mg by mouth daily.     . sodium chloride (MURO 128) 5 % ophthalmic solution Place 1 drop into both eyes 3 (three) times daily.      No current facility-administered medications for this visit.     Review of Systems Review of Systems  Constitutional: Negative.   Respiratory: Negative.    Cardiovascular: Negative.     Blood pressure (!) 154/68, pulse 84, resp. rate 12, height 4\' 11"  (1.499 m), weight 122 lb (55.3 kg), SpO2 98 %.  Physical Exam Physical Exam  Constitutional: She is oriented to person, place, and time. She appears well-developed and well-nourished.  Neurological: She is alert and oriented to person, place, and time.  Skin: Skin is warm and dry.  .8 x 1.5 cm LLE Treated with silver nitrate.  Psychiatric: Her behavior is normal.       Assessment    Slow healing wound at melanoma excision site.    Plan    Continue present treatment.  Apply skin cream to the dry skin around the wound.  Vaseline to the wound itself.    Follow up in one month.   HPI, Physical Exam, Assessment and Plan have been scribed under the direction and in the presence of Robert Bellow, MD. Karie Fetch, RN  I have completed the exam and reviewed the above documentation for accuracy and completeness.  I agree with the above.  Haematologist has been used and any errors in dictation or transcription are unintentional.  Hervey Ard, M.D., F.A.C.S.   Forest Gleason Byrnett 01/22/2017, 2:58 PM

## 2017-02-19 ENCOUNTER — Ambulatory Visit (INDEPENDENT_AMBULATORY_CARE_PROVIDER_SITE_OTHER): Payer: Medicare Other | Admitting: General Surgery

## 2017-02-19 ENCOUNTER — Encounter: Payer: Self-pay | Admitting: General Surgery

## 2017-02-19 VITALS — BP 122/70 | HR 69 | Resp 12 | Ht 64.0 in | Wt 121.0 lb

## 2017-02-19 DIAGNOSIS — C4372 Malignant melanoma of left lower limb, including hip: Secondary | ICD-10-CM

## 2017-02-19 NOTE — Progress Notes (Signed)
Patient ID: Angie Perez, female   DOB: March 12, 1927, 84 y.o.   MRN: [id number]  Chief Complaint  Patient presents with  . Other    HPI Angie Perez is a 84 y.o. female Angie Perez is a 84 y.o. female here today for her follow up left legmelanoma. Patient states she is doing well . HPI  Past Medical History:  Diagnosis Date  . Fuchs' syndrome II   . IBS (irritable bowel syndrome)   . Osteoporosis   . Peripheral neuropathy     Past Surgical History:  Procedure Laterality Date  . ABDOMINAL HYSTERECTOMY  1970  . CATARACT EXTRACTION    . EXCISION MELANOMA WITH SENTINEL LYMPH NODE BIOPSY Left 03/17/2016   Procedure: EXCISION MELANOMA LEFT LEG;  Surgeon: Robert Bellow, MD;  Location: [facility];  Service: General;  Laterality: Left;  . KNEE SURGERY Right 1994  . SENTINEL NODE BIOPSY Left 03/17/2016   Procedure: SENTINEL NODE BIOPSY;  Surgeon: Robert Bellow, MD;  Location: [facility];  Service: General;  Laterality: Left;  . WRIST SURGERY Right 2008    Family History  Problem Relation Age of Onset  . Cancer Mother   . Heart disease Father     Social History Social History   Tobacco Use  . Smoking status: Never Smoker  . Smokeless tobacco: Never Used  Substance Use Topics  . Alcohol use: No    Alcohol/week: 0.0 oz  . Drug use: No    Allergies  Allergen Reactions  . Latex Rash  . Naproxen Nausea Only and Rash    Current Outpatient Medications  Medication Sig Dispense Refill  . aspirin EC 81 MG tablet Take 81 mg by mouth daily.     [name] gabapentin (NEURONTIN) 800 MG tablet Take 800 mg by mouth at bedtime.     . raloxifene (EVISTA) 60 MG tablet Take 60 mg by mouth daily.     . sodium chloride (MURO 128) 5 % ophthalmic solution Place 1 drop into both eyes 3 (three) times daily.      No current facility-administered medications for this visit.     Review of Systems Review of Systems  Constitutional: Negative.   Respiratory: Negative.    Cardiovascular: Negative.     Blood pressure 122/70, pulse 69, resp. rate 12, height 5\' 4"  (1.626 m), weight 121 lb (54.9 kg).  Physical Exam Physical Exam  Constitutional: She is oriented to person, place, and time. She appears well-developed and well-nourished.  Neurological: She is alert and oriented to person, place, and time.  Skin: Skin is warm and dry.    .7 by 1.8  cm LLE  Treated with silver nitrate.     Assessment    Slow improvement.    Plan   Return in one month. The patient is aware to call back for any questions or concerns.  HPI, Physical Exam, Assessment and Plan have been scribed under the direction and in the presence of Hervey Ard, MD.  Gaspar Cola, CMA   Gaspar Cola 02/19/2017, 2:23 PM

## 2017-02-19 NOTE — Patient Instructions (Signed)
Return in one month. The patient is aware to call back for any questions or concerns.  

## 2017-03-19 ENCOUNTER — Encounter: Payer: Self-pay | Admitting: General Surgery

## 2017-03-19 ENCOUNTER — Ambulatory Visit (INDEPENDENT_AMBULATORY_CARE_PROVIDER_SITE_OTHER): Payer: Medicare Other | Admitting: General Surgery

## 2017-03-19 VITALS — BP 122/68 | HR 70 | Resp 12 | Ht 60.0 in | Wt 121.0 lb

## 2017-03-19 DIAGNOSIS — C4372 Malignant melanoma of left lower limb, including hip: Secondary | ICD-10-CM

## 2017-03-19 NOTE — Patient Instructions (Signed)
The patient is aware to call back for any questions or concerns.  

## 2017-03-19 NOTE — Progress Notes (Signed)
Patient ID: Angie Perez, female   DOB: [birth year], 84 y.o.   MRN: [id number]  Chief Complaint  Patient presents with  . Follow-up    HPI Angie Perez is a 84 y.o. female here today for her follow up left leg melanoma.  HPI  Past Medical History:  Diagnosis Date  . Fuchs' syndrome II   . IBS (irritable bowel syndrome)   . Osteoporosis   . Peripheral neuropathy     Past Surgical History:  Procedure Laterality Date  . ABDOMINAL HYSTERECTOMY  1970  . CATARACT EXTRACTION    . EXCISION MELANOMA WITH SENTINEL LYMPH NODE BIOPSY Left 03/17/2016   Procedure: EXCISION MELANOMA LEFT LEG;  Surgeon: Robert Bellow, MD;  Location: [facility];  Service: General;  Laterality: Left;  . KNEE SURGERY Right 1994  . SENTINEL NODE BIOPSY Left 03/17/2016   Procedure: SENTINEL NODE BIOPSY;  Surgeon: Robert Bellow, MD;  Location: [facility];  Service: General;  Laterality: Left;  . WRIST SURGERY Right 2008    Family History  Problem Relation Age of Onset  . Cancer Mother   . Heart disease Father     Social History Social History   Tobacco Use  . Smoking status: Never Smoker  . Smokeless tobacco: Never Used  Substance Use Topics  . Alcohol use: No    Alcohol/week: 0.0 oz  . Drug use: No    Allergies  Allergen Reactions  . Latex Rash  . Naproxen Nausea Only and Rash    Current Outpatient Medications  Medication Sig Dispense Refill  . aspirin EC 81 MG tablet Take 81 mg by mouth daily.     [name] gabapentin (NEURONTIN) 800 MG tablet Take 800 mg by mouth at bedtime.     . raloxifene (EVISTA) 60 MG tablet Take 60 mg by mouth daily.     . sodium chloride (MURO 128) 5 % ophthalmic solution Place 1 drop into both eyes 3 (three) times daily.      No current facility-administered medications for this visit.     Review of Systems Review of Systems  Constitutional: Negative.   Respiratory: Negative.   Cardiovascular: Negative.     Blood pressure 122/68, pulse 70, resp.  rate 12, height 5' (1.524 m), weight 121 lb (54.9 kg).  Physical Exam Physical Exam  Constitutional: She is oriented to person, place, and time. She appears well-developed and well-nourished.  Musculoskeletal:       Legs: Neurological: She is alert and oriented to person, place, and time.  Skin: Skin is warm and dry.  9 x 9 and 3 x 5 left posterior leg wound, silver nitrate application.Forehead irritated dry skin.  Psychiatric: Her behavior is normal.       Assessment    Continued improvement in healing by secondary intent.     Plan  Patient to return in one month   HPI, Physical Exam, Assessment and Plan have been scribed under the direction and in the presence of Robert Bellow, MD. Karie Fetch, RN  I have completed the exam and reviewed the above documentation for accuracy and completeness.  I agree with the above.  Haematologist has been used and any errors in dictation or transcription are unintentional.  Hervey Ard, M.D., F.A.C.S.   Angie Perez 03/19/2017, 8:27 PM

## 2017-04-23 ENCOUNTER — Encounter: Payer: Self-pay | Admitting: General Surgery

## 2017-04-23 ENCOUNTER — Ambulatory Visit (INDEPENDENT_AMBULATORY_CARE_PROVIDER_SITE_OTHER): Payer: Medicare Other | Admitting: General Surgery

## 2017-04-23 VITALS — BP 118/66 | Resp 14 | Ht 60.0 in | Wt 123.0 lb

## 2017-04-23 DIAGNOSIS — C4372 Malignant melanoma of left lower limb, including hip: Secondary | ICD-10-CM

## 2017-04-23 NOTE — Progress Notes (Signed)
Patient ID: Angie Perez, female   DOB: 03/24/1927, 84 y.o.   MRN: [id number]  Chief Complaint  Patient presents with  . Follow-up    HPI Angie Perez is a 84 y.o. female here today for her follow up left leg melanoma. Patient states the area  Is doing well.  HPI  Past Medical History:  Diagnosis Date  . Fuchs' syndrome II   . IBS (irritable bowel syndrome)   . Osteoporosis   . Peripheral neuropathy     Past Surgical History:  Procedure Laterality Date  . ABDOMINAL HYSTERECTOMY  1970  . CATARACT EXTRACTION    . EXCISION MELANOMA WITH SENTINEL LYMPH NODE BIOPSY Left 03/17/2016   Procedure: EXCISION MELANOMA LEFT LEG;  Surgeon: Robert Bellow, MD;  Location: [facility];  Service: General;  Laterality: Left;  . KNEE SURGERY Right 1994  . SENTINEL NODE BIOPSY Left 03/17/2016   Procedure: SENTINEL NODE BIOPSY;  Surgeon: Robert Bellow, MD;  Location: [facility];  Service: General;  Laterality: Left;  . WRIST SURGERY Right 2008    Family History  Problem Relation Age of Onset  . Cancer Mother   . Heart disease Father     Social History Social History   Tobacco Use  . Smoking status: Never Smoker  . Smokeless tobacco: Never Used  Substance Use Topics  . Alcohol use: No    Alcohol/week: 0.0 oz  . Drug use: No    Allergies  Allergen Reactions  . Latex Rash  . Naproxen Nausea Only and Rash    Current Outpatient Medications  Medication Sig Dispense Refill  . aspirin EC 81 MG tablet Take 81 mg by mouth daily.     [name] gabapentin (NEURONTIN) 800 MG tablet Take 800 mg by mouth at bedtime.     . raloxifene (EVISTA) 60 MG tablet Take 60 mg by mouth daily.     . sodium chloride (MURO 128) 5 % ophthalmic solution Place 1 drop into both eyes 3 (three) times daily.      No current facility-administered medications for this visit.     Review of Systems Review of Systems  Constitutional: Negative.   Respiratory: Negative.     Blood pressure 118/66, resp.  rate 14, height 5' (1.524 m), weight 123 lb (55.8 kg).  Physical Exam Physical Exam  Constitutional: She is oriented to person, place, and time. She appears well-developed and well-nourished.  Musculoskeletal:       Feet:  Neurological: She is alert and oriented to person, place, and time.  Skin: Skin is warm and dry.      Assessment    Healing melanoma site.  Mild calf edema left greater than right.  Patient reports she has been evaluated by her PCP in this regard.    Plan    Patient to return in six weeks.  The patient is aware to call back for any questions or concerns.     HPI, Physical Exam, Assessment and Plan have been scribed under the direction and in the presence of Hervey Ard, MD.  Gaspar Cola, CMA  I have completed the exam and reviewed the above documentation for accuracy and completeness.  I agree with the above.  Haematologist has been used and any errors in dictation or transcription are unintentional.  Hervey Ard, M.D., F.A.C.S.  Forest Gleason Peityn Payton 04/24/2017, 7:16 AM

## 2017-04-23 NOTE — Patient Instructions (Addendum)
Return in six weeks

## 2017-06-04 ENCOUNTER — Ambulatory Visit (INDEPENDENT_AMBULATORY_CARE_PROVIDER_SITE_OTHER): Payer: Medicare Other | Admitting: General Surgery

## 2017-06-04 ENCOUNTER — Encounter: Payer: Self-pay | Admitting: General Surgery

## 2017-06-04 VITALS — BP 144/62 | HR 87 | Resp 14 | Ht 62.0 in | Wt 127.0 lb

## 2017-06-04 DIAGNOSIS — C4372 Malignant melanoma of left lower limb, including hip: Secondary | ICD-10-CM

## 2017-06-04 NOTE — Patient Instructions (Addendum)
The patient is aware to call back for any questions or concerns. Return in three months.

## 2017-06-04 NOTE — Progress Notes (Signed)
Patient ID: Angie Perez, female   DOB: [birth year], 84 y.o.   MRN: [id number]  Chief Complaint  Patient presents with  . Follow-up    HPI Angie Perez is a 84 y.o. female here today for her follow up left leg melanoma. Patient staets the area is doing well.  HPI  Past Medical History:  Diagnosis Date  . Fuchs' syndrome II   . IBS (irritable bowel syndrome)   . Osteoporosis   . Peripheral neuropathy     Past Surgical History:  Procedure Laterality Date  . ABDOMINAL HYSTERECTOMY  1970  . CATARACT EXTRACTION    . EXCISION MELANOMA WITH SENTINEL LYMPH NODE BIOPSY Left 03/17/2016   Procedure: EXCISION MELANOMA LEFT LEG;  Surgeon: Robert Bellow, MD;  Location: [facility];  Service: General;  Laterality: Left;  . KNEE SURGERY Right 1994  . SENTINEL NODE BIOPSY Left 03/17/2016   Procedure: SENTINEL NODE BIOPSY;  Surgeon: Robert Bellow, MD;  Location: [facility];  Service: General;  Laterality: Left;  . WRIST SURGERY Right 2008    Family History  Problem Relation Age of Onset  . Cancer Mother   . Heart disease Father     Social History Social History   Tobacco Use  . Smoking status: Never Smoker  . Smokeless tobacco: Never Used  Substance Use Topics  . Alcohol use: No    Alcohol/week: 0.0 oz  . Drug use: No    Allergies  Allergen Reactions  . Latex Rash  . Naproxen Nausea Only and Rash    Current Outpatient Medications  Medication Sig Dispense Refill  . aspirin EC 81 MG tablet Take 81 mg by mouth daily.     [name] gabapentin (NEURONTIN) 800 MG tablet Take 800 mg by mouth at bedtime.     . raloxifene (EVISTA) 60 MG tablet Take 60 mg by mouth daily.     . sodium chloride (MURO 128) 5 % ophthalmic solution Place 1 drop into both eyes 3 (three) times daily.      No current facility-administered medications for this visit.     Review of Systems Review of Systems  Constitutional: Negative.   Respiratory: Negative.   Cardiovascular: Negative.      Blood pressure (!) 144/62, pulse 87, resp. rate 14, height 5\' 2"  (1.575 m), weight 127 lb (57.6 kg).  Physical Exam Physical Exam  Constitutional: She is oriented to person, place, and time. She appears well-developed and well-nourished.  Musculoskeletal:       Legs: Lymphadenopathy:       Left: No inguinal adenopathy present.  Neurological: She is alert and oriented to person, place, and time.  Skin: Skin is warm and dry.        Assessment    Near complete healing of the wide excision site.    Plan Patient will continue her regimen of daily washing and a drop of Neosporin.  She may dispense with the bandages irritating her skin.  Return in three months.  The patient is aware to call back for any questions or concerns.   HPI, Physical Exam, Assessment and Plan have been scribed under the direction and in the presence of Hervey Ard, MD.  Gaspar Cola, CMA  I have completed the exam and reviewed the above documentation for accuracy and completeness.  I agree with the above.  Haematologist has been used and any errors in dictation or transcription are unintentional.  Hervey Ard, M.D., F.A.C.S.   Forest Gleason Nichael Ehly 06/05/2017, 6:29  PM   

## 2017-07-29 ENCOUNTER — Encounter: Payer: Self-pay | Admitting: *Deleted

## 2017-09-03 ENCOUNTER — Encounter: Payer: Self-pay | Admitting: General Surgery

## 2017-09-03 ENCOUNTER — Ambulatory Visit (INDEPENDENT_AMBULATORY_CARE_PROVIDER_SITE_OTHER): Payer: Medicare Other | Admitting: General Surgery

## 2017-09-03 VITALS — BP 124/72 | HR 74 | Resp 13 | Ht 60.0 in | Wt 126.0 lb

## 2017-09-03 DIAGNOSIS — C4372 Malignant melanoma of left lower limb, including hip: Secondary | ICD-10-CM | POA: Diagnosis not present

## 2017-09-03 NOTE — Progress Notes (Signed)
Patient ID: Angie Perez, female   DOB: [birth year], 84 y.o.   MRN: [id number]  Chief Complaint  Patient presents with  . Follow-up    HPI Angie Perez is a 84 y.o. female  Here today for her follow up up left leg melanoma. Patient staets the area is doing well  Past Medical History:  Diagnosis Date  . Fuchs' syndrome II   . IBS (irritable bowel syndrome)   . Osteoporosis   . Peripheral neuropathy     Past Surgical History:  Procedure Laterality Date  . ABDOMINAL HYSTERECTOMY  1970  . CATARACT EXTRACTION    . EXCISION MELANOMA WITH SENTINEL LYMPH NODE BIOPSY Left 03/17/2016   Procedure: EXCISION MELANOMA LEFT LEG;  Surgeon: Robert Bellow, MD;  Location: [facility];  Service: General;  Laterality: Left;  . KNEE SURGERY Right 1994  . SENTINEL NODE BIOPSY Left 03/17/2016   Procedure: SENTINEL NODE BIOPSY;  Surgeon: Robert Bellow, MD;  Location: [facility];  Service: General;  Laterality: Left;  . WRIST SURGERY Right 2008    Family History  Problem Relation Age of Onset  . Cancer Mother   . Heart disease Father     Social History Social History   Tobacco Use  . Smoking status: Never Smoker  . Smokeless tobacco: Never Used  Substance Use Topics  . Alcohol use: No    Alcohol/week: 0.0 standard drinks  . Drug use: No    Allergies  Allergen Reactions  . Latex Rash  . Naproxen Nausea Only and Rash    Current Outpatient Medications  Medication Sig Dispense Refill  . aspirin EC 81 MG tablet Take 81 mg by mouth daily.     [name] gabapentin (NEURONTIN) 800 MG tablet Take 800 mg by mouth at bedtime.     . raloxifene (EVISTA) 60 MG tablet Take 60 mg by mouth daily.     . sodium chloride (MURO 128) 5 % ophthalmic solution Place 1 drop into both eyes 3 (three) times daily.      No current facility-administered medications for this visit.     Review of Systems Review of Systems  Constitutional: Negative.   Respiratory: Negative.   Cardiovascular: Negative.      Blood pressure 124/72, pulse 74, resp. rate 13, height 5' (1.524 m), weight 126 lb (57.2 kg).  Physical Exam Physical Exam  Constitutional: She is oriented to person, place, and time. She appears well-developed and well-nourished.  Cardiovascular: Normal rate, regular rhythm and normal heart sounds.  Pulmonary/Chest: Effort normal and breath sounds normal.  Neurological: She is alert and oriented to person, place, and time.  Skin: Skin is warm and dry.  Moderate bilateral lower extremity edema.  Crusting at the left lateral distal calf without surrounding erythema, induration or purulent drainage.     Assessment  Steady improvement at site of melanoma excision.    Plan   Patient has been instructed to make use of Neosporin ointment to the area. Patient to return in two months.  The patient is aware to call back for any questions or concerns.    HPI, Physical Exam, Assessment and Plan have been scribed under the direction and in the presence of Hervey Ard, MD.  Gaspar Cola, CMA  I have completed the exam and reviewed the above documentation for accuracy and completeness.  I agree with the above.  Haematologist has been used and any errors in dictation or transcription are unintentional.  Hervey Ard, M.D., F.A.C.S.  Dellis Filbert  W Nisha Dhami 09/03/2017, 8:57 PM

## 2017-09-03 NOTE — Patient Instructions (Signed)
Patient has been instructed to make use of Neosporin ointment to the area. Patient to return in two months.  The patient is aware to call back for any questions or concerns.

## 2017-11-03 ENCOUNTER — Encounter: Payer: Self-pay | Admitting: General Surgery

## 2017-11-03 ENCOUNTER — Ambulatory Visit (INDEPENDENT_AMBULATORY_CARE_PROVIDER_SITE_OTHER): Payer: Medicare Other | Admitting: General Surgery

## 2017-11-03 VITALS — BP 122/72 | HR 74 | Resp 13 | Ht 60.0 in | Wt 125.0 lb

## 2017-11-03 DIAGNOSIS — C4372 Malignant melanoma of left lower limb, including hip: Secondary | ICD-10-CM

## 2017-11-03 NOTE — Patient Instructions (Signed)
Return as needed.The patient is aware to call back for any questions or concerns.  

## 2017-11-03 NOTE — Progress Notes (Signed)
Patient ID: Angie Perez, female   DOB: 20-Oct-1927, 84 y.o.   MRN: [id number]  Chief Complaint  Patient presents with  . Follow-up    HPI Angie Perez is a 84 y.o. female here today for her follow up left leg melanoma. Patient states she is doing well.  HPI  Past Medical History:  Diagnosis Date  . Fuchs' syndrome II   . IBS (irritable bowel syndrome)   . Osteoporosis   . Peripheral neuropathy     Past Surgical History:  Procedure Laterality Date  . ABDOMINAL HYSTERECTOMY  1970  . CATARACT EXTRACTION    . EXCISION MELANOMA WITH SENTINEL LYMPH NODE BIOPSY Left 03/17/2016   Procedure: EXCISION MELANOMA LEFT LEG;  Surgeon: Robert Bellow, MD;  Location: [facility];  Service: General;  Laterality: Left;  . KNEE SURGERY Right 1994  . SENTINEL NODE BIOPSY Left 03/17/2016   Procedure: SENTINEL NODE BIOPSY;  Surgeon: Robert Bellow, MD;  Location: [facility];  Service: General;  Laterality: Left;  . WRIST SURGERY Right 2008    Family History  Problem Relation Age of Onset  . Cancer Mother   . Heart disease Father     Social History Social History   Tobacco Use  . Smoking status: Never Smoker  . Smokeless tobacco: Never Used  Substance Use Topics  . Alcohol use: No    Alcohol/week: 0.0 standard drinks  . Drug use: No    Allergies  Allergen Reactions  . Latex Rash  . Naproxen Nausea Only and Rash    Current Outpatient Medications  Medication Sig Dispense Refill  . aspirin EC 81 MG tablet Take 81 mg by mouth daily.     [name] gabapentin (NEURONTIN) 800 MG tablet Take 800 mg by mouth at bedtime.     . raloxifene (EVISTA) 60 MG tablet Take 60 mg by mouth daily.     . sodium chloride (MURO 128) 5 % ophthalmic solution Place 1 drop into both eyes 3 (three) times daily.      No current facility-administered medications for this visit.     Review of Systems Review of Systems  Constitutional: Negative.   Respiratory: Negative.   Cardiovascular: Negative.      Blood pressure 122/72, pulse 74, resp. rate 13, height 5' (1.524 m), weight 125 lb (56.7 kg).  Physical Exam Physical Exam  Constitutional: She is oriented to person, place, and time. She appears well-developed and well-nourished.  Musculoskeletal:       Legs: Lymphadenopathy:       Left: No inguinal adenopathy present.  Neurological: She is alert and oriented to person, place, and time.  Skin: Skin is warm and dry.      Assessment    Near complete healing of the myeloma wide excision site.  No evidence of in-transit metastasis.  No inguinal adenopathy.    Plan  Return as needed The patient is aware to call back for any questions or concerns.  HPI, Physical Exam, Assessment and Plan have been scribed under the direction and in the presence of Hervey Ard, MD.  Gaspar Cola, CMA   Forest Gleason Byrnett 11/03/2017, 9:42 PM

## 2019-01-26 IMAGING — NM NM LYMPHATICS/LYMPH NODE
1 series · 8 of 8 positions shown · non-contrast
Comparison: None.

CLINICAL DATA: History of melanoma on the posterior aspect the left
lower leg. Please perform radiotracer injection for the purposes of
sentinel lymph node identification.

EXAM:
NUCLEAR MEDICINE LYMPHANGIOGRAPHY
TECHNIQUE: Sequential images were obtained following intradermal injection of
radiopharmaceutical at the tumor site in the posterior aspect the
left lower leg.
RADIOPHARMACEUTICALS:  1 mCi millipore-filtered 3c-VVm sulfur
colloid, administered in 4 separate injections about the left lower
leg resection site.

[Series 1000: sent node static · 2.40mm/px · 5 acquisitions, 8 frames shown]
[im 1/5]
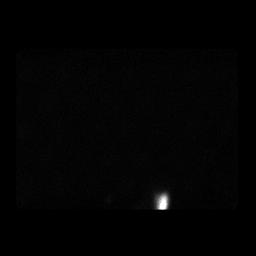
[im 1/5]
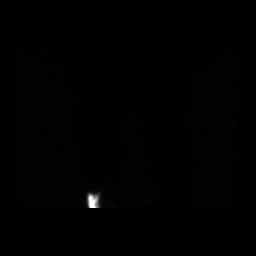
[im 2/5  full-range]
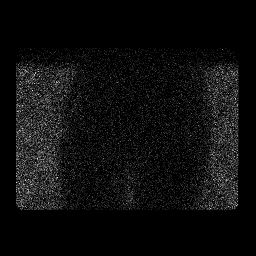
[im 2/5  full-range]
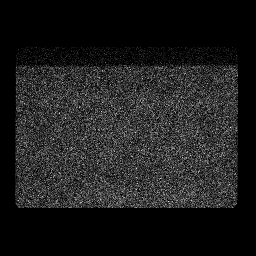
[im 3/5  full-range]
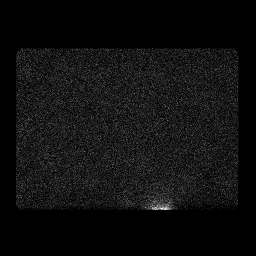
[im 3/5  full-range]
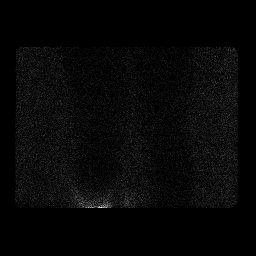
[im 4/5  full-range]
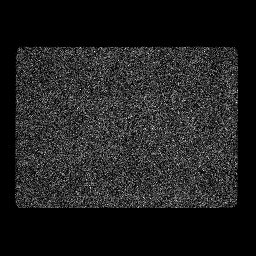
[im 5/5  full-range]
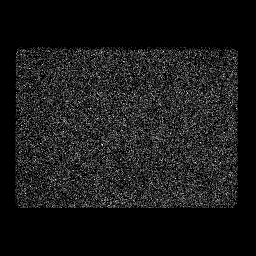

[8 of 8 positions shown; findings below may reference images not displayed]

FINDINGS: Provided planar images demonstrate expected radiotracer activity
about the injection site of the left lower leg.

Subsequent planar images demonstrate faint radiotracer uptake within
a left inguinal and pelvic lymph nodes as was confirmed on provided
lateral planar images.

Both of the left inguinal and pelvic lymph nodes were marked by the
dictating interventional radiologist.
IMPRESSION: Technically successful subcutaneous radiotracer injection about left
lower leg melanoma resection site and identification of sentinel
left inguinal and pelvic lymph nodes.
